# Patient Record
Sex: Female | Born: 1960 | Race: Black or African American | Hispanic: No | Marital: Married
Health system: Midwestern US, Community
[De-identification: ages and names within clinical notes are randomized; demographics above are authoritative.]

## PROBLEM LIST (undated history)

## (undated) DIAGNOSIS — I10 Essential (primary) hypertension: Secondary | ICD-10-CM

## (undated) DIAGNOSIS — E042 Nontoxic multinodular goiter: Secondary | ICD-10-CM

## (undated) DIAGNOSIS — R55 Syncope and collapse: Secondary | ICD-10-CM

## (undated) DIAGNOSIS — M5417 Radiculopathy, lumbosacral region: Secondary | ICD-10-CM

## (undated) DIAGNOSIS — F17210 Nicotine dependence, cigarettes, uncomplicated: Secondary | ICD-10-CM

## (undated) DIAGNOSIS — Z87891 Personal history of nicotine dependence: Secondary | ICD-10-CM

## (undated) DIAGNOSIS — E041 Nontoxic single thyroid nodule: Secondary | ICD-10-CM

## (undated) DIAGNOSIS — G40209 Localization-related (focal) (partial) symptomatic epilepsy and epileptic syndromes with complex partial seizures, not intractable, without status epilepticus: Secondary | ICD-10-CM

## (undated) DIAGNOSIS — Z1231 Encounter for screening mammogram for malignant neoplasm of breast: Secondary | ICD-10-CM

---

## 2004-06-20 ENCOUNTER — Other Ambulatory Visit: Admission: RE | Admit: 2004-06-20 | Discharge: 2004-06-20 | Payer: Self-pay | Admitting: Obstetrics and Gynecology

## 2007-02-28 ENCOUNTER — Ambulatory Visit: Payer: Self-pay | Admitting: *Deleted

## 2007-08-12 ENCOUNTER — Ambulatory Visit (HOSPITAL_COMMUNITY): Admission: RE | Admit: 2007-08-12 | Discharge: 2007-08-12 | Payer: Self-pay | Admitting: General Surgery

## 2007-08-16 ENCOUNTER — Encounter: Admission: RE | Admit: 2007-08-16 | Discharge: 2007-08-16 | Payer: Self-pay | Admitting: *Deleted

## 2008-01-02 ENCOUNTER — Encounter: Admission: RE | Admit: 2008-01-02 | Discharge: 2008-01-30 | Payer: Self-pay | Admitting: General Surgery

## 2008-01-17 ENCOUNTER — Ambulatory Visit (HOSPITAL_COMMUNITY): Admission: RE | Admit: 2008-01-17 | Discharge: 2008-01-18 | Payer: Self-pay | Admitting: General Surgery

## 2008-04-11 ENCOUNTER — Encounter: Admission: RE | Admit: 2008-04-11 | Discharge: 2008-04-11 | Payer: Self-pay | Admitting: General Surgery

## 2008-05-24 ENCOUNTER — Emergency Department (HOSPITAL_BASED_OUTPATIENT_CLINIC_OR_DEPARTMENT_OTHER): Admission: EM | Admit: 2008-05-24 | Discharge: 2008-05-24 | Payer: Self-pay | Admitting: Emergency Medicine

## 2010-12-30 ENCOUNTER — Emergency Department (HOSPITAL_BASED_OUTPATIENT_CLINIC_OR_DEPARTMENT_OTHER)
Admission: EM | Admit: 2010-12-30 | Discharge: 2010-12-31 | Disposition: A | Discharge status: Home / Self Care | Payer: Managed Care, Other (non HMO) | Attending: Emergency Medicine | Admitting: Emergency Medicine

## 2010-12-30 DIAGNOSIS — R51 Headache: Secondary | ICD-10-CM | POA: Insufficient documentation

## 2010-12-30 DIAGNOSIS — R040 Epistaxis: Secondary | ICD-10-CM | POA: Insufficient documentation

## 2010-12-30 DIAGNOSIS — I10 Essential (primary) hypertension: Secondary | ICD-10-CM | POA: Insufficient documentation

## 2010-12-30 DIAGNOSIS — J45909 Unspecified asthma, uncomplicated: Secondary | ICD-10-CM | POA: Insufficient documentation

## 2010-12-30 DIAGNOSIS — F17200 Nicotine dependence, unspecified, uncomplicated: Secondary | ICD-10-CM | POA: Insufficient documentation

## 2011-01-03 ENCOUNTER — Emergency Department (HOSPITAL_BASED_OUTPATIENT_CLINIC_OR_DEPARTMENT_OTHER)
Admission: EM | Admit: 2011-01-03 | Discharge: 2011-01-03 | Disposition: A | Discharge status: Home / Self Care | Payer: Managed Care, Other (non HMO) | Attending: Emergency Medicine | Admitting: Emergency Medicine

## 2011-01-03 DIAGNOSIS — I10 Essential (primary) hypertension: Secondary | ICD-10-CM | POA: Insufficient documentation

## 2011-01-03 DIAGNOSIS — J45909 Unspecified asthma, uncomplicated: Secondary | ICD-10-CM | POA: Insufficient documentation

## 2011-01-08 ENCOUNTER — Ambulatory Visit
Admission: RE | Admit: 2011-01-08 | Discharge: 2011-01-08 | Disposition: A | Discharge status: Home / Self Care | Payer: Managed Care, Other (non HMO) | Source: Ambulatory Visit | Attending: General Surgery | Admitting: General Surgery

## 2011-01-08 ENCOUNTER — Other Ambulatory Visit: Payer: Self-pay | Admitting: General Surgery

## 2011-01-08 DIAGNOSIS — R1907 Generalized intra-abdominal and pelvic swelling, mass and lump: Secondary | ICD-10-CM

## 2011-01-08 DIAGNOSIS — K439 Ventral hernia without obstruction or gangrene: Secondary | ICD-10-CM

## 2011-01-08 MED ORDER — IOHEXOL 300 MG/ML  SOLN
125.0000 mL | Freq: Once | INTRAMUSCULAR | Status: AC | PRN
  Administered 2011-01-08: 125 mL via INTRAVENOUS

## 2011-03-10 NOTE — Op Note (Signed)
NAME:  Ashley, Johnston NO.:  [id number]   MEDICAL RECORD NO.:  [id number]          PATIENT TYPE:  OIB   LOCATION:  0098                         FACILITY:  [facility]   PHYSICIAN:  Sharlet Salina T. Hoxworth, M.D.DATE OF BIRTH:  1961/07/05   DATE OF PROCEDURE:  01/17/2008  DATE OF DISCHARGE:                               OPERATIVE REPORT   PREOPERATIVE DIAGNOSES:  Morbid obesity.   POSTOPERATIVE DIAGNOSES:  Morbid obesity.   SURGICAL PROCEDURES:  Placement of laparoscopic adjustable gastric band.   SURGEON:  Dr. Johna Sheriff.   ASSISTANT:  Dr. Ovidio Kin   ANESTHESIA:  General.   BRIEF HISTORY:  Ashley Johnston is a 50 year old female with progressive  morbid obesity unresponsive to medical management and comorbidities  including hypertension and obstructive sleep apnea.  After extensive  preoperative discussion and workup detailed elsewhere, we have elected  to proceed with placement of laparoscopic adjustable gastric band for  treatment of her morbid obesity.   DESCRIPTION OF OPERATION:  The patient was brought to the operating  room, placed in supine position on the operating table and general  endotracheal anesthesia was induced.  The abdomen was widely sterilely  prepped and draped.  She had received preoperative IV antibiotics.  PAS  were placed.  Subcutaneous heparin had been administered preoperatively.  The correct patient and procedure were verified.  Local anesthesia was  used to infiltrate the trocar sites.  Abdominal access was obtained with  an 11 mm OptiVu trocar in the left subcostal space without difficulty  and pneumoperitoneum established.  Under direct vision, a 15 mm trocar  was placed in the right subcostal space through the falciform ligament  and an 11 mm trocar in the right mid abdomen, another 11 mm trocar in  the left periumbilical area for the camera port and a 5 mm trocar placed  in the left flank.  Through a 5-mm subxiphoid site, the [facility]  retractor was placed.  The liver was somewhat enlarged but we were able  to get very good exposure of the hiatus and upper stomach with the  [facility] retractor.  The angle of His was exposed and peritoneum was  incised over the left crus and careful blunt dissection was carried back  down along the left crus toward the retrogastric space with the finger  dissector.  Following this, the gastrohepatic ligament was opened along  an avascular area and the base of the right crus clearly identified.  The peritoneum here was incised at the area of crossing fat and careful  blunt dissection was carried back into the retrogastric space without  difficulty.  The finger dissector was then passed into this space and  deployed up through the previously dissected area of the angle of His  without difficulty.  Following this, a flushed AP standard band system  was introduced.  The tubing was put through the finger retractor and  then brought back behind the stomach without difficulty and the band  passed behind the stomach easily.  The sizing tube was then introduced  into the stomach and the band was  clipped into place without any undue  tension.  The sizing tube was removed.  Holding the tubing toward the  patient's feet, the fundus was imbricated up over the band with several  interrupted 2-0 Ethibond sutures.  The band appeared to be in excellent  position.  Complete hemostasis was assured.  The tubing was brought out  through the right mid abdominal site and all CO2 evacuated, the  [facility] retractor removed and trocars removed. This right mid  abdominal incision was lengthened somewhat, the anterior fascia exposed.  Four 2-0 Prolene sutures were placed for anchoring of the port.  The  tubing was cut, the port attached and then secured to the fascia with  the previously placed Prolene sutures.  The tubing was seen to curve  smoothly into the abdomen. This incision was irrigated.  The subcu at  the  port site was closed with running 3-0 Vicryl.  The skin was closed  with subcuticular 4-0 Monocryl with Dermabond.  Sponge, needle and  instrument counts were correct.  The patient was taken to recovery in  good condition.      Lorne Skeens. Hoxworth, M.D.  Electronically Signed     BTH/MEDQ  D:  01/17/2008  T:  01/17/2008  Job:  [id number]

## 2011-07-20 LAB — CBC
HCT: 37.3
Hemoglobin: 12.6
RBC: 4.09

## 2011-07-20 LAB — BASIC METABOLIC PANEL
CO2: 27
Calcium: 8.6
Chloride: 103
GFR calc Af Amer: >60
GFR calc non Af Amer: 57 — ABNORMAL LOW
Potassium: 3.6
Sodium: 136

## 2011-07-20 LAB — DIFFERENTIAL
Basophils Relative: 0
Eosinophils Absolute: 0
Eosinophils Relative: 0
Lymphocytes Relative: 10 — ABNORMAL LOW
Lymphs Abs: 1.5
Monocytes Absolute: 0.5
Monocytes Relative: 3
Neutro Abs: 12.4 — ABNORMAL HIGH

## 2011-07-20 LAB — HEMOGLOBIN AND HEMATOCRIT, BLOOD: HCT: 38.8

## 2011-07-20 LAB — PREGNANCY, URINE: Preg Test, Ur: NEGATIVE

## 2011-07-24 LAB — RAPID STREP SCREEN (MED CTR MEBANE ONLY): Streptococcus, Group A Screen (Direct): NEGATIVE

## 2011-09-25 IMAGING — CT CT ABD-PELV W/ CM
2 of 5 series · 17 of 46 positions shown, 19 images · IV contrast (30CC OMNI 300 & [ID] OMNI 300)
Comparison: None

CLINICAL DATA: Abdominal pain.  Assess for ventral hernia.  History
of gastric bypass surgery.

CT ABDOMEN AND PELVIS WITH CONTRAST
TECHNIQUE: Multidetector CT imaging of the abdomen and pelvis was
performed following the standard protocol during bolus
administration of intravenous contrast.
Contrast: 125 ml Tmnipaque-JVV.

[Series 2: abdomen w/ · axial · 0.79mm/px · z∈[-394,-44]mm · 14 of 80 slices shown, 16 images]
[im 5/80  soft-tissue]
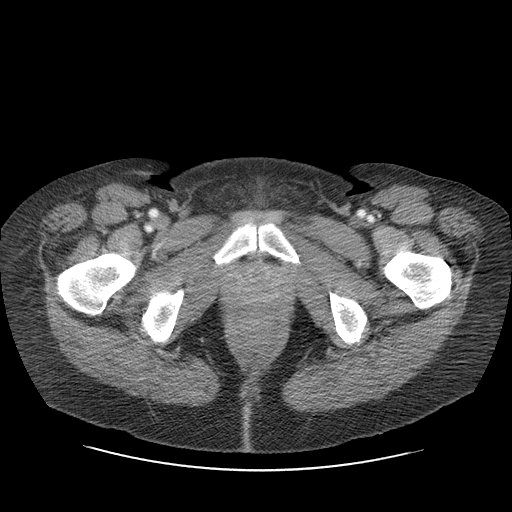
[im 5/80  bone]
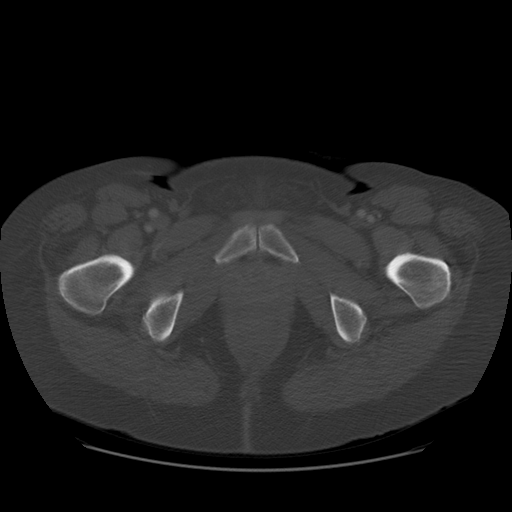
[im 9/80  soft-tissue]
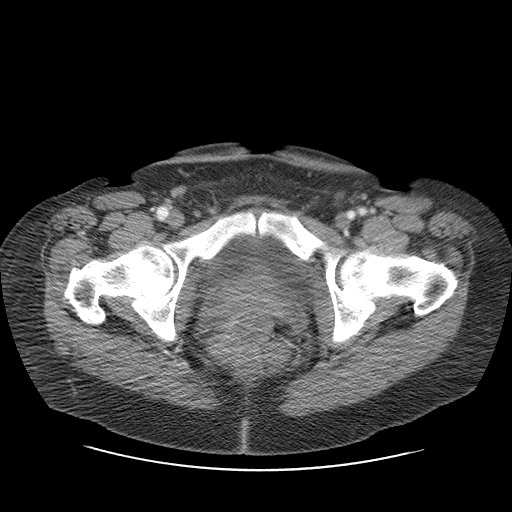
[im 18/80  soft-tissue]
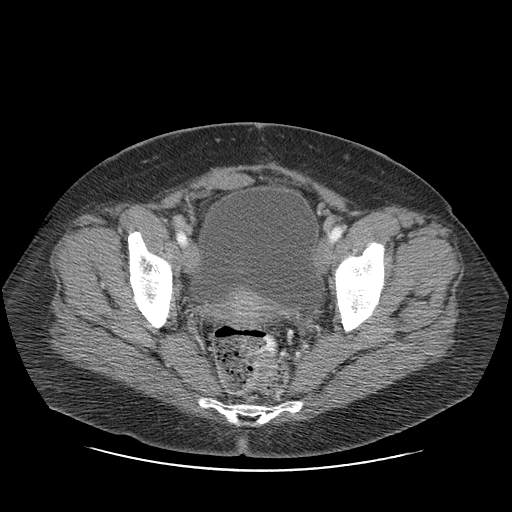
[im 22/80  soft-tissue]
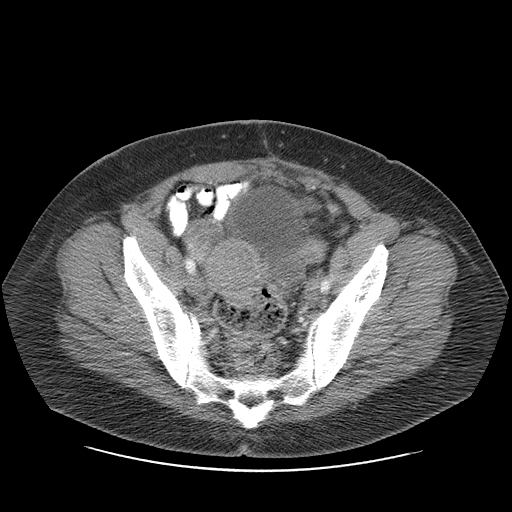
[im 27/80  soft-tissue]
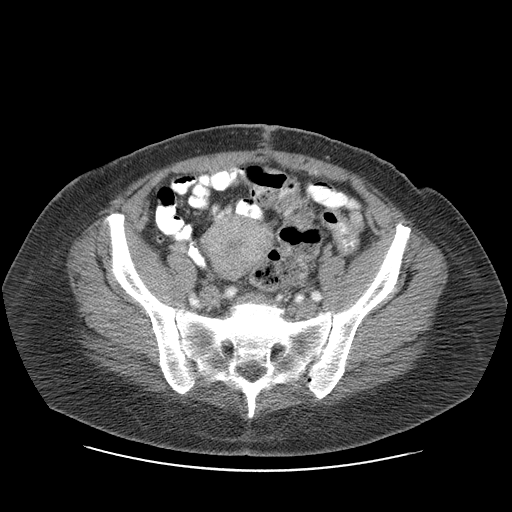
[im 31/80  soft-tissue]
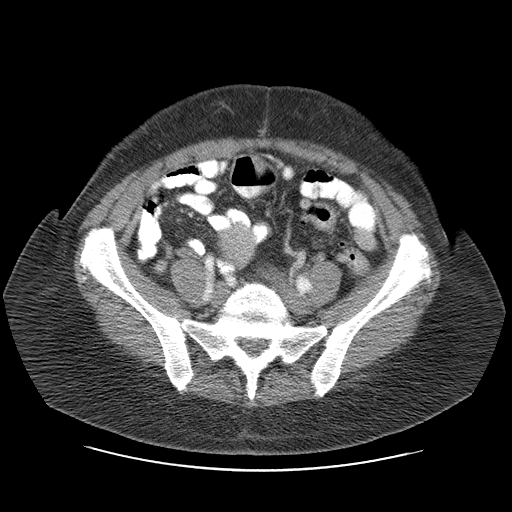
[im 36/80  soft-tissue]
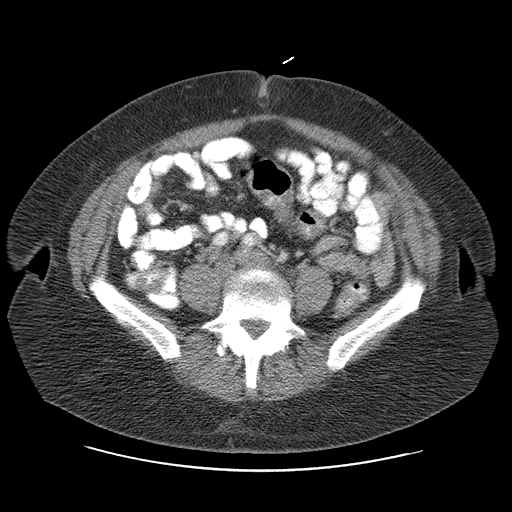
[im 44/80  soft-tissue]
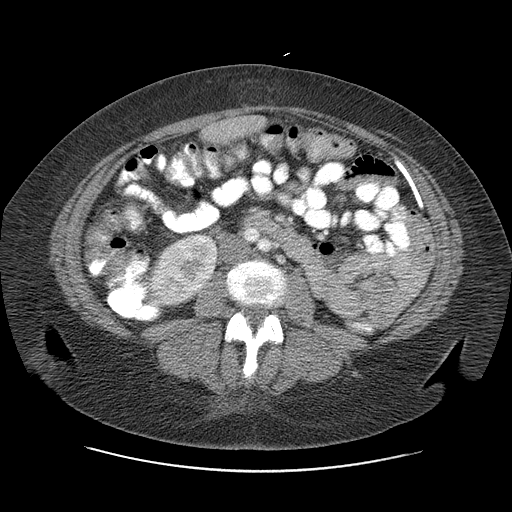
[im 49/80  soft-tissue]
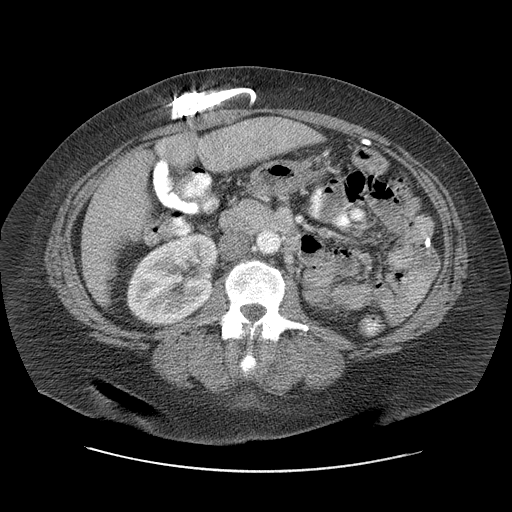
[im 49/80  bone]
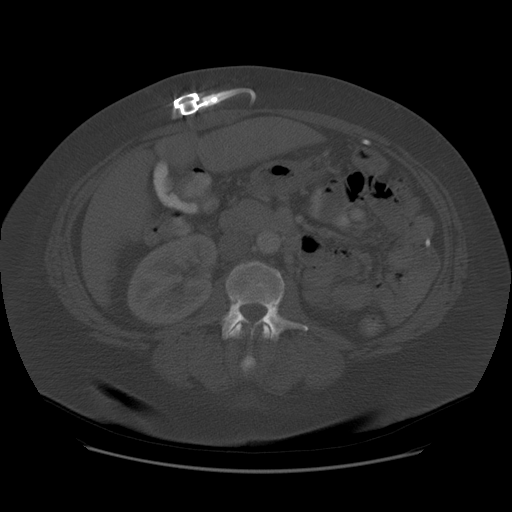
[im 53/80  soft-tissue]
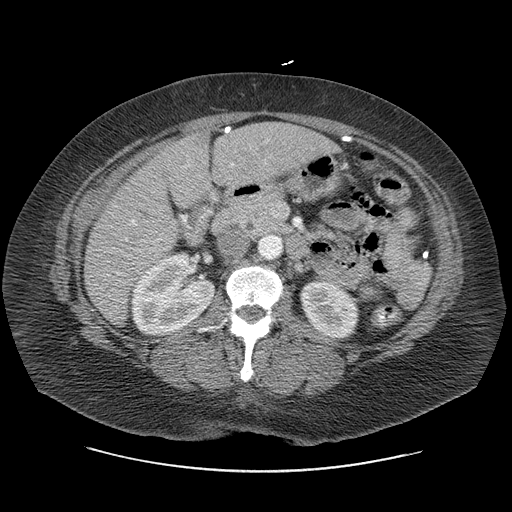
[im 58/80  soft-tissue]
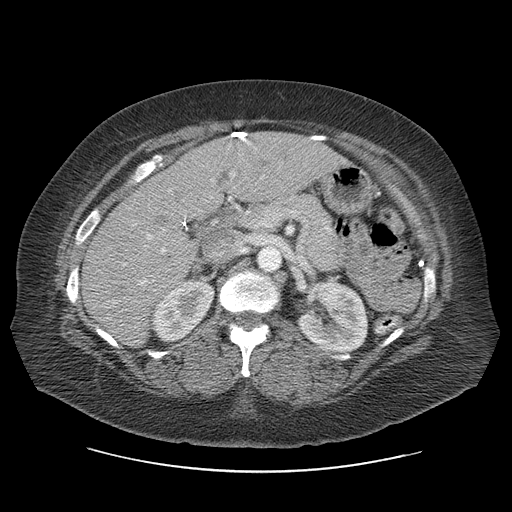
[im 62/80  soft-tissue]
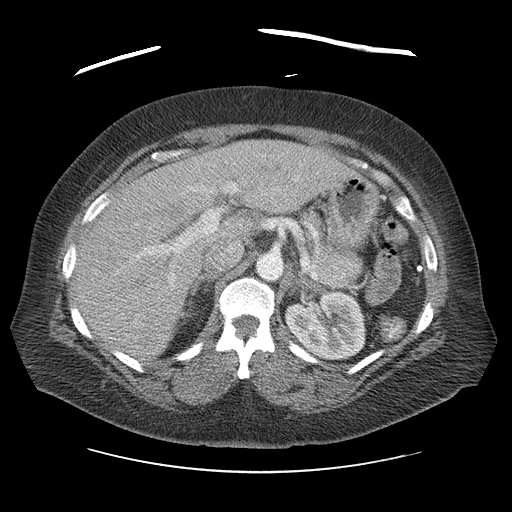
[im 71/80  soft-tissue]
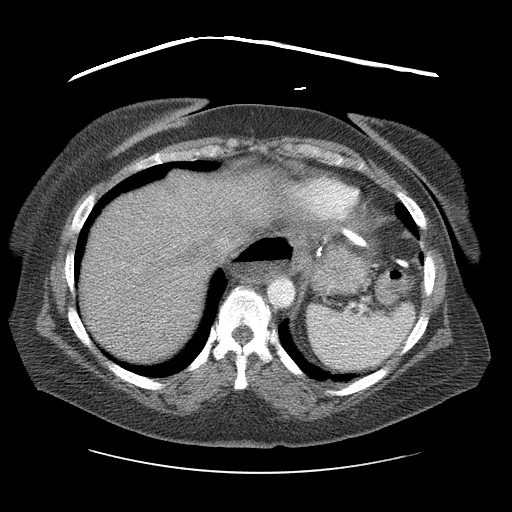
[im 75/80  soft-tissue]
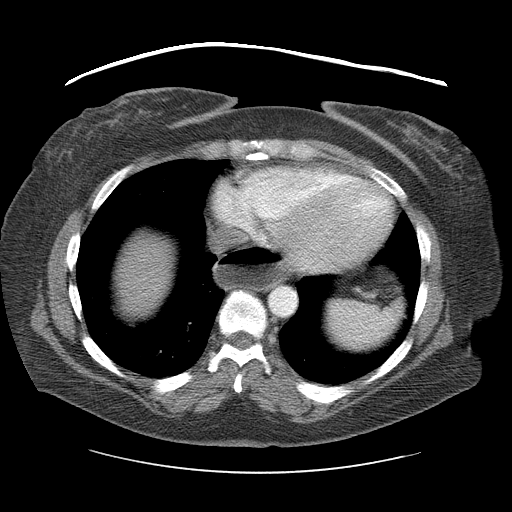

[Series 400: cor · coronal · 0.83mm/px · 3 of 170 slices shown]
[im 57/170  soft-tissue]
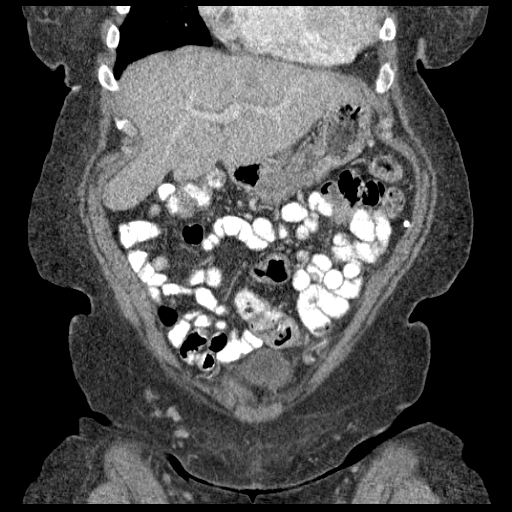
[im 76/170  soft-tissue]
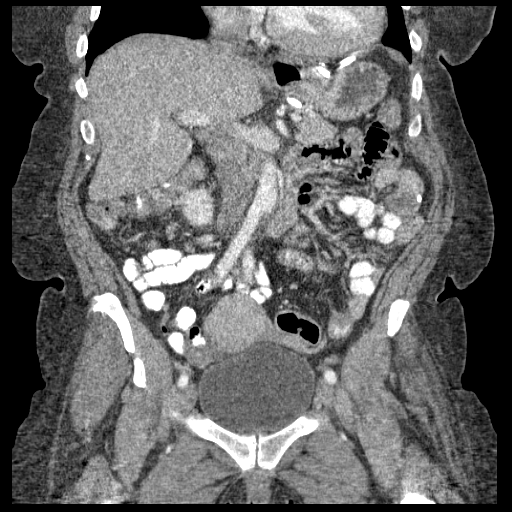
[im 94/170  soft-tissue]
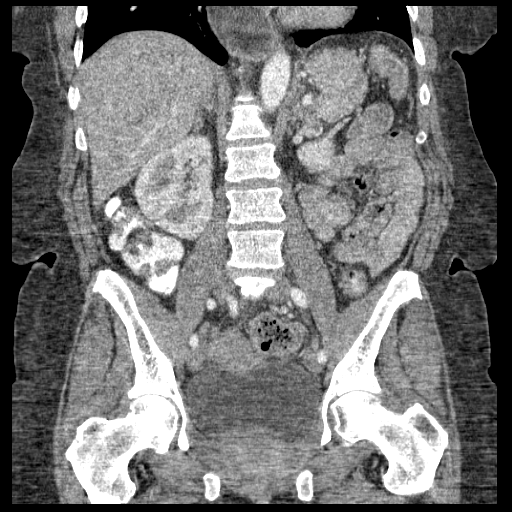

[17 of 46 positions shown; findings below may reference images not displayed]

FINDINGS: The lung bases are clear.  There is a moderate-to-large
hiatal hernia.

The liver demonstrates no significant findings.  The gallbladder is
surgically absent.  No common bile duct dilatation.  Pancreas is
unremarkable and spleen is normal in size. The adrenal glands and
kidneys demonstrate no significant findings.

A gastric lap band is noted.  It appears to be well positioned with
normal orientation.  The duodenum, small bowel and colon
demonstrate no significant findings.  The appendix is normal.  No
mesenteric or retroperitoneal masses or adenopathy. The aorta is
normal in caliber.  No dissection.

The uterus and ovaries are unremarkable.  No pelvic mass,
adenopathy or free pelvic fluid collection.  No inguinal mass or
hernia. A very small periumbilical abdominal wall hernia is noted
containing a small amount of fat.

The bony structures are unremarkable.
IMPRESSION: 1.  Gastric lap band appears in good position with normal
orientation but there is a moderate-to-large hiatal hernia.
2.  Small periumbilical abdominal wall hernia containing small
amount of fat.
3.  No significant intra abdominal/pelvic findings.

## 2017-11-15 ENCOUNTER — Encounter: Attending: Physician Assistant | Primary: Physician Assistant

## 2017-12-07 ENCOUNTER — Ambulatory Visit: Attending: Physician Assistant | Primary: Physician Assistant

## 2017-12-07 ENCOUNTER — Ambulatory Visit
Admit: 2017-12-07 | Discharge: 2017-12-07 | Payer: PRIVATE HEALTH INSURANCE | Attending: Physician Assistant | Primary: Physician Assistant

## 2017-12-07 DIAGNOSIS — R7301 Impaired fasting glucose: Secondary | ICD-10-CM

## 2017-12-07 MED ORDER — AMLODIPINE 5 MG TAB
5 mg | ORAL_TABLET | Freq: Every day | ORAL | 0 refills | Status: DC
Start: 2017-12-07 — End: 2018-02-07

## 2017-12-07 MED ORDER — HYDROCHLOROTHIAZIDE 25 MG TAB
25 mg | ORAL_TABLET | Freq: Every day | ORAL | 0 refills | Status: DC
Start: 2017-12-07 — End: 2018-02-07

## 2017-12-07 MED ORDER — ATENOLOL 50 MG TAB
50 mg | ORAL_TABLET | Freq: Every day | ORAL | 0 refills | Status: DC
Start: 2017-12-07 — End: 2018-02-07

## 2017-12-07 NOTE — Addendum Note (Signed)
Addended by: Trey Sailors on: 12/21/2017 11:30 AM     Modules accepted: Orders

## 2017-12-07 NOTE — Progress Notes (Addendum)
ESTABLISH CARE VISIT    SUBJECTIVE:     Chief Complaint   Patient presents with   ??? Establish Care   ??? Hypertension     Been without BP meds for approx 6 weeks.       HPI: 57 y.o. female with PMHx significant for  is here for the above chief complaint(s).    Hypertension  Diagnosed at [place], Patient has taken lisinopril, losartan, cardizem in the past. She most recently taking atenolol 50mg  daily, amlodipine 5mg , hctz 25mg . BP is usually controlled on these medications, has been out for 6 weeks.   History- was having chest pain, so cardiac cath was performed and surgeon planned on doing angioplasty, but arteries were clear.     Gastric Band:  10 years, hasn't had any follow-up with gastroenterologist. Would like a follow-up. She denies abdominal pain, n/v/d.       Menopause:  >3 years,     Health Maintenance:    Eye ??? needs referral,    Oral Health ??? dental health, will give name.   U/A ???  no UTI sxs.   Cardiac-  Pulmonary health/Smoking history- , current every day smoker of 1ppd for 30 years, denies cough, SOB.-  Mammo/GYN- due for mammogram, will order  Colonoscopy - has never had baseline, will refer  Current smoker- 0.5-1ppd x 30+  years-      Review of Systems   Constitutional: Negative for chills, fever, malaise/fatigue and weight loss.   Eyes: Negative for blurred vision, double vision and pain.   Respiratory: Negative for cough, sputum production, shortness of breath and wheezing.    Cardiovascular: Negative for chest pain, palpitations, orthopnea, claudication and leg swelling.   Gastrointestinal: Negative for abdominal pain, constipation, diarrhea, nausea and vomiting.   Genitourinary: Negative for dysuria, frequency and urgency.   Neurological: Negative for dizziness, tingling and headaches.         @CURRMEDS @  Current Outpatient Medications   Medication Sig   ??? atenolol (TENORMIN) 50 mg tablet Take  by mouth daily.   ??? amLODIPine (NORVASC) 5 mg tablet Take 5 mg by mouth daily.    ??? hydroCHLOROthiazide (HYDRODIURIL) 25 mg tablet Take 25 mg by mouth daily.   ??? folic acid/multivit-min/lutein (CENTRUM SILVER PO) Take  by mouth.     No current facility-administered medications for this visit.      Health Maintenance   Topic Date Due   ??? Hepatitis C Screening  03/20/1961   ??? DTaP/Tdap/Td series (1 - Tdap) 06/24/1983   ??? PAP AKA CERVICAL CYTOLOGY  06/24/1983   ??? Shingrix Vaccine Age 38> (1 of 2) 06/23/2012   ??? BREAST CANCER SCRN MAMMOGRAM  06/23/2012   ??? FOBT Q 1 YEAR AGE 24-75  06/23/2012   ??? Influenza Age 61 to Adult  05/26/2017       Medications and Allergies: Reviewed and confirmed in the chart    Past Medical Hx: Reviewed and confirmed in the chart  Past Medical History:   Diagnosis Date   ??? Asthma    ??? Hypertension        Patient Active Problem List   Diagnosis Code   ??? Severe obesity ([place]) E66.01       Family Hx, Surgical Hx, Social Hx: Reviewed and updated in EMR    OBJECTIVE:  Vitals:    12/07/17 1250   BP: (!) 180/98   Pulse: 87   Resp: 16   Temp: 97.6 ??F (36.4 ??C)   TempSrc: Oral  SpO2: 100%   Weight: 224 lb 6.4 oz (101.8 kg)   Height: 5\' 7"  (1.702 m)       BP Readings from Last 3 Encounters:   12/07/17 (!) 180/98     Wt Readings from Last 3 Encounters:   12/07/17 224 lb 6.4 oz (101.8 kg)       Physical Exam   Constitutional: She is oriented to person, place, and time and well-developed, well-nourished, and in no distress. No distress.   Neck: Normal range of motion. Neck supple. No thyromegaly present.   Cardiovascular: Normal rate, regular rhythm, normal heart sounds and intact distal pulses. Exam reveals no gallop and no friction rub.   No murmur heard.  Pulmonary/Chest: Effort normal and breath sounds normal. No respiratory distress. She has no wheezes. She has no rales. She exhibits no tenderness.   Lymphadenopathy:     She has no cervical adenopathy.   Neurological: She is alert and oriented to person, place, and time.   Skin: Skin is warm and dry. She is not diaphoretic.    Psychiatric: Mood, memory, affect and judgment normal.   Vitals reviewed.          Nursing Notes Reviewed  ASSESSMENT AND PLAN  Diagnoses and all orders for this visit:    1. Elevated fasting glucose  -     MICROALBUMIN, UR, RAND W/ MICROALB/CREAT RATIO; Future  -     HEMOGLOBIN A1C WITH EAG; Future    2. Screening for lipid disorders  -     LIPID PANEL; Future    3. Vitamin D deficiency  -     VITAMIN D, 1, 25 DIHYDROXY; Future    4. Hx of laparoscopic gastric banding  -     REFERRAL TO GASTROENTEROLOGY  -     VITAMIN B12; Future    5. Hypertension, unspecified type  -     CBC WITH AUTOMATED DIFF; Future  -     METABOLIC PANEL, COMPREHENSIVE; Future  -     MICROALBUMIN, UR, RAND W/ MICROALB/CREAT RATIO; Future  -     atenolol (TENORMIN) 50 mg tablet; Take 1 Tab by mouth daily.  -     amLODIPine (NORVASC) 5 mg tablet; Take 1 Tab by mouth daily.  -     hydroCHLOROthiazide (HYDRODIURIL) 25 mg tablet; Take 1 Tab by mouth daily.    6. Screening for thyroid disorder  -     TSH 3RD GENERATION; Future    7. B12 deficiency  -     VITAMIN B12; Future    8. Screening mammogram, encounter for  -     MAM MAMMO BI SCREENING INCL CAD; Future    9. Thyromegaly  -     T4, FREE; Future  -     T3 TOTAL; Future  -     US THYROID/PARATHYROID/SOFT TISS; Future    10. Continuous tobacco abuse  -     CT LOW DOSE LUNG CANCER SCREENING; Future    11. Encounter for eye exam  -     REFERRAL TO OPHTHALMOLOGY    RTC in 1 month for BP check and lab review.           Orders Placed This Encounter   ??? atenolol (TENORMIN) 50 mg tablet   ??? amLODIPine (NORVASC) 5 mg tablet   ??? hydroCHLOROthiazide (HYDRODIURIL) 25 mg tablet   ??? folic acid/multivit-min/lutein (CENTRUM SILVER PO)       No future appointments.  AVS printed and provided to patient    Assessment and plan above discussed with patient, patient voiced understanding and agreement with plan.    More than 50% of this 30 minute visit was spent face to face counseling  the patient about the etiology and treatment options for the above health conditions outlined in assessment and plan     Mikki Ziff L. [facility]  [address], [place] [address]  Ph - [phone]

## 2017-12-07 NOTE — Progress Notes (Signed)
Chief Complaint   Patient presents with   ??? Establish Care   ??? Hypertension     Been without BP meds for approx 6 weeks.

## 2017-12-14 ENCOUNTER — Encounter

## 2017-12-16 ENCOUNTER — Inpatient Hospital Stay: Payer: BLUE CROSS/BLUE SHIELD | Attending: Physician Assistant | Primary: Physician Assistant

## 2017-12-16 ENCOUNTER — Ambulatory Visit: Primary: Physician Assistant

## 2017-12-17 NOTE — Telephone Encounter (Signed)
Received call from [facility] requesting ICD-10 code be changed to Dx: F17.210.    Pt's CT is scheduled for next week 12/24/2017.

## 2017-12-20 NOTE — Telephone Encounter (Signed)
Vicki Greene called to check on status of request from Friday and also she needs dr's notes asap or they will have to [place]

## 2017-12-21 ENCOUNTER — Encounter: Attending: Physician Assistant | Primary: Physician Assistant

## 2017-12-21 NOTE — Telephone Encounter (Signed)
error 

## 2017-12-22 ENCOUNTER — Encounter: Primary: Physician Assistant

## 2017-12-22 NOTE — Progress Notes (Signed)
LUNG CANCER SCREENING:  This Letter was mailed to the Patient and faxed to the ordering physician.    Vicki Greene  [address] [place] [address]       Dear [name],  RE: Your low-dose CT lung screening done on: 12/24/2017  Interpreted by: Dr. Rodman Pickle  Report sent to: Gevena Barre PA  Thank you for choosing [facility] for your recent CT lung screening. As we discussed over the phone, we are sending this follow-up letter to confirm your testing results. Your CT lung screening showed no nodule or mass. However, there is a finding on your exam, which may require further evaluation. This does not necessarily mean there is a serious problem, but it should not be ignored. Please contact your health care provider to discuss these results and the next step in your medical care. Your next lung screening exam is scheduled for: March 2020. Because this notice is far in advance, we will send you a reminder when the time gets closer.  Here are some other important points:  ? Please remember that you have freedom of choice when choosing a health care facility.   ? Your full low-dose CT lung screening report, including any minor observations, has been sent to your health care provider. Your exam report and images will be kept on file at [facility] as part of your permanent record and are available for your continuing care.  ? Although low-dose CT lung screening is very effective at finding lung cancer early, it cannot find all lung cancers. If you develop any new symptoms such as shortness of breath or chest pain, or you begin coughing up blood, please contact your doctor immediately.  ? Please keep in mind that good health involves quitting smoking (for help, call 1-800-QUIT-NOW), an annual physical exam and a yearly low-dose CT lung screening.  As I stated during our last phone conversation, I am always here to answer  any questions you may have. If you have further questions about your results, questions about this letter or difficulty contacting your health care provider, please don???t hesitate to call me at [phone].   Sincerely,       Schuyler Amor, R.N., B.S.N.  Thoracic and Lung Health Nurse Navigator  Phone: [place]  12/28/2017

## 2017-12-24 ENCOUNTER — Inpatient Hospital Stay: Admit: 2017-12-24 | Payer: BLUE CROSS/BLUE SHIELD | Attending: Physician Assistant | Primary: Physician Assistant

## 2017-12-24 ENCOUNTER — Inpatient Hospital Stay
Admit: 2017-12-24 | Discharge: 2017-12-24 | Payer: BLUE CROSS/BLUE SHIELD | Attending: Physician Assistant | Primary: Physician Assistant

## 2017-12-24 ENCOUNTER — Encounter

## 2017-12-24 DIAGNOSIS — E010 Iodine-deficiency related diffuse (endemic) goiter: Secondary | ICD-10-CM

## 2017-12-24 DIAGNOSIS — Z1231 Encounter for screening mammogram for malignant neoplasm of breast: Secondary | ICD-10-CM

## 2017-12-24 LAB — METABOLIC PANEL, COMPREHENSIVE
ALT (SGPT): 15 U/L (ref 12–78)
AST (SGOT): 18 U/L (ref 15–37)
Albumin: 3.6 gm/dl (ref 3.4–5.0)
Alk. phosphatase: 115 U/L (ref 45–117)
Anion gap: 5 mmol/L (ref 5–15)
BUN: 11 mg/dl (ref 7–25)
Bilirubin, total: 0.4 mg/dl (ref 0.2–1.0)
CO2: 28 mEq/L (ref 21–32)
Calcium: 9 mg/dl (ref 8.5–10.1)
Chloride: 106 mEq/L (ref 98–107)
Creatinine: 1 mg/dl (ref 0.6–1.3)
GFR est AA: >60
GFR est non-AA: >60
Glucose: 87 mg/dl (ref 74–106)
Potassium: 3.9 mEq/L (ref 3.5–5.1)
Protein, total: 7.1 gm/dl (ref 6.4–8.2)
Sodium: 139 mEq/L (ref 136–145)

## 2017-12-24 LAB — T4, FREE: Free T4: 0.82 ng/dl (ref 0.76–1.46)

## 2017-12-24 LAB — CBC WITH AUTOMATED DIFF
BASOPHILS: 0.6 % (ref 0–3)
EOSINOPHILS: 2.6 % (ref 0–5)
HCT: 41.6 % (ref 37.0–50.0)
HGB: 13.4 gm/dl (ref 13.0–17.2)
IMMATURE GRANULOCYTES: 0.3 % (ref 0.0–3.0)
LYMPHOCYTES: 45.2 % (ref 28–48)
MCH: 31.3 pg (ref 25.4–34.6)
MCHC: 32.2 gm/dl (ref 30.0–36.0)
MCV: 97.2 fL (ref 80.0–98.0)
MONOCYTES: 7.7 % (ref 1–13)
MPV: 10.2 fL — ABNORMAL HIGH (ref 6.0–10.0)
NEUTROPHILS: 43.6 % (ref 34–64)
NRBC: 0 (ref 0–0)
PLATELET: 245 10*3/uL (ref 140–450)
RBC: 4.28 M/uL (ref 3.60–5.20)
RDW-SD: 46 (ref 36.4–46.3)
WBC: 6.6 10*3/uL (ref 4.0–11.0)

## 2017-12-24 LAB — TSH 3RD GENERATION: TSH: 1.27 u[IU]/mL (ref 0.358–3.740)

## 2017-12-25 ENCOUNTER — Inpatient Hospital Stay: Payer: BLUE CROSS/BLUE SHIELD | Attending: Physician Assistant | Primary: Physician Assistant

## 2017-12-26 LAB — VITAMIN D, 1, 25 DIHYDROXY
Vitamin D,1,25 Dihydroxy: 65 pg/mL (ref 18–72)
Vitamin D2, 1,25 (OH)2: <8 pg/mL
Vitamin D3, 1,25 (OH)2: 65 pg/mL

## 2017-12-27 ENCOUNTER — Telehealth

## 2017-12-27 NOTE — Telephone Encounter (Signed)
Spoke with patient about her lab results. All WNL.    Mykeal Carrick L. Lonisha Bobby, PA-C  [facility]  [address]  [place] [address]

## 2017-12-27 NOTE — Telephone Encounter (Signed)
Spoke with patient regarding her thyroid ultrasound. Because one of the nodules is larger and hypervascular, will refer to ENT. Also discussed CT results, her esophagus is fluid-filled, radiology recommending GI evaluation. Patient has referral for colonoscopy, will send CT results to GI and recommend EGD. Patient is amenable to plan.     Joleen Stuckert L. Negar Sieler, PA-C  [facility]  [address]  [place] [address]

## 2017-12-28 NOTE — Telephone Encounter (Signed)
Can you please send this patient's CT to the gastro doc that we recently referred her to, please?    Dalanie Kisner L. Destanie Tibbetts, PA-C  [facility]  [address]  [place] [address]

## 2017-12-30 NOTE — Telephone Encounter (Signed)
Done!

## 2018-01-07 NOTE — Telephone Encounter (Signed)
Please let patient know her mammogram was negative. Repeat in 1 year.     Ori Trejos L. Neshawn Aird, PA-C  [facility]  [address]  [place] [address]

## 2018-01-07 NOTE — Telephone Encounter (Signed)
Patient informed of mammogram results as detailed in providers notes. Patient verbalized understanding.

## 2018-01-18 ENCOUNTER — Encounter

## 2018-01-28 ENCOUNTER — Ambulatory Visit

## 2018-01-28 ENCOUNTER — Inpatient Hospital Stay: Admit: 2018-01-28 | Payer: BLUE CROSS/BLUE SHIELD | Attending: Otolaryngology | Primary: Physician Assistant

## 2018-01-28 ENCOUNTER — Encounter

## 2018-01-28 DIAGNOSIS — D34 Benign neoplasm of thyroid gland: Secondary | ICD-10-CM

## 2018-01-28 MED ORDER — LIDOCAINE (PF) 10 MG/ML (1 %) IJ SOLN
10 mg/mL (1 %) | Freq: Once | INTRAMUSCULAR | Status: AC
Start: 2018-01-28 — End: 2018-01-28
  Administered 2018-01-28: 14:00:00 via SUBCUTANEOUS

## 2018-01-28 NOTE — Progress Notes (Signed)
Interventional Radiologist: Barbaraann Boys, MD  Pre-operative Diagnosis: Left thyroid nodule    Post-operative Diagnosis: Same as pre-operative diagnosis    Procedure(s) Performed:  Ultrasound guided thyroid Fine Needle Aspiration    Anesthesia:  Local     Findings:   Informed consent.  US guided thyroid FNA performed of left thyroid nodule.  Technically difficult secondary to lower pole location    Complications: No immediate    Estimated Blood Loss:  Minimal    Specimens: 5 passes    Barbaraann Boys, MD  January 28, 2018  11:25 AM

## 2018-01-28 NOTE — H&P (Signed)
BRIEF HISTORY AND PHYSICAL    Clinical History:   Left thyroid nodule    HPI:  Patient with left  thyroid nodule for ultrasound fine needle aspiration    Allergies:      Allergies   Allergen Reactions   ??? Lisinopril Swelling   ??? Pcn [Penicillins] Rash       Current Meds:       Current Outpatient Medications on File Prior to Encounter   Medication Sig Dispense Refill   ??? folic acid/multivit-min/lutein (CENTRUM SILVER PO) Take  by mouth.     ??? atenolol (TENORMIN) 50 mg tablet Take 1 Tab by mouth daily. 90 Tab 0   ??? amLODIPine (NORVASC) 5 mg tablet Take 1 Tab by mouth daily. 90 Tab 0   ??? hydroCHLOROthiazide (HYDRODIURIL) 25 mg tablet Take 1 Tab by mouth daily. 90 Tab 0     No current facility-administered medications on file prior to encounter.            @mednamec @    Comorbid Conditions:    Past Medical History:   Diagnosis Date   ??? Asthma    ??? Hypertension           Past Surgical History:   Procedure Laterality Date   ??? HX CHOLECYSTECTOMY     ??? HX GI  2009    lap band    ??? HX GYN      c-section   ??? HX GYN      ovarian cyst   ??? HX GYN      ablation   ??? HX ORTHOPAEDIC      fractured ankle       Data:    Visit Vitals  BP 144/88 (BP 1 Location: Left arm)   Pulse 77   Resp 17   :    No results found for: PTP, INR, APTT    CBC w/Diff    Lab Results   Component Value Date/Time    WBC 6.6 12/24/2017 08:57 AM    RBC 4.28 12/24/2017 08:57 AM    HGB 13.4 12/24/2017 08:57 AM    HCT 41.6 12/24/2017 08:57 AM    MCV 97.2 12/24/2017 08:57 AM    MCH 31.3 12/24/2017 08:57 AM    MCHC 32.2 12/24/2017 08:57 AM    PLT 245 12/24/2017 08:57 AM        Lab Results   Component Value Date/Time    GRANS 43.6 12/24/2017 08:57 AM    LYMPH 45.2 12/24/2017 08:57 AM    MONOS 7.7 12/24/2017 08:57 AM    EOS 2.6 12/24/2017 08:57 AM    BASOS 0.6 12/24/2017 08:57 AM      Basic Metabolic Profile   Lab Results   Component Value Date    NA 139 12/24/2017    K 3.9 12/24/2017    CL 106 12/24/2017    CO2 28 12/24/2017    BUN 11 12/24/2017     CREA 1.0 12/24/2017    GLU 87 12/24/2017    CA 9.0 12/24/2017           No results found for: HCGUQC, [id number], [id number], [id number], PREGU, POCHCG, MHCGN, HCGQR, THCGA1, SHCG, HCGN, HCGSERUM, HCGURQLPOC     Physical Exam:     General Appearance:   Appears in no acute distress.     Site:  No redness, swelling    Impression:  Left thyroid nodule for ultrasound guided fine needle aspiration    Plan:  NPO  Consent  Barbaraann Boys, MD  January 28, 2018  11:24 AM  Vascular & Interventional Radiology

## 2018-02-03 NOTE — Telephone Encounter (Signed)
Labs still in process. Can take up to 2 weeks for pathology results to come back. Thank you.    Zienna Ahlin L. Tyashia Morrisette, PA-C  [facility]  [address]  [place] [address]

## 2018-02-03 NOTE — Telephone Encounter (Signed)
Pt requesting Biopsy results. States had done a week ago.  States please call to discuss

## 2018-02-03 NOTE — Telephone Encounter (Signed)
Patient informed that biopsy results are still in process and can take up to 2 weeks for results.  Patient verbalized understanding.

## 2018-02-07 ENCOUNTER — Encounter

## 2018-02-07 NOTE — Telephone Encounter (Signed)
Per fax from [facility] requesting a 90 day supply  Requested Prescriptions     Pending Prescriptions Disp Refills   ??? atenolol (TENORMIN) 50 mg tablet 90 Tab 0     Sig: Take 1 Tab by mouth daily.   ??? amLODIPine (NORVASC) 5 mg tablet 90 Tab 0     Sig: Take 1 Tab by mouth daily.   ??? hydroCHLOROthiazide (HYDRODIURIL) 25 mg tablet 90 Tab 0     Sig: Take 1 Tab by mouth daily.

## 2018-02-08 MED ORDER — HYDROCHLOROTHIAZIDE 25 MG TAB
25 mg | ORAL_TABLET | Freq: Every day | ORAL | 0 refills | Status: DC
Start: 2018-02-08 — End: 2018-04-23

## 2018-02-08 MED ORDER — ATENOLOL 50 MG TAB
50 mg | ORAL_TABLET | Freq: Every day | ORAL | 0 refills | Status: DC
Start: 2018-02-08 — End: 2018-04-23

## 2018-02-08 MED ORDER — AMLODIPINE 5 MG TAB
5 mg | ORAL_TABLET | Freq: Every day | ORAL | 0 refills | Status: DC
Start: 2018-02-08 — End: 2018-04-23

## 2018-03-17 ENCOUNTER — Ambulatory Visit: Attending: Physician Assistant | Primary: Physician Assistant

## 2018-03-17 ENCOUNTER — Ambulatory Visit
Admit: 2018-03-17 | Discharge: 2018-03-17 | Payer: PRIVATE HEALTH INSURANCE | Attending: Physician Assistant | Primary: Physician Assistant

## 2018-03-17 DIAGNOSIS — F17210 Nicotine dependence, cigarettes, uncomplicated: Secondary | ICD-10-CM

## 2018-03-17 MED ORDER — LANSOPRAZOLE 30 MG CAP, DELAYED RELEASE
30 mg | ORAL_CAPSULE | Freq: Every day | ORAL | 0 refills | Status: AC
Start: 2018-03-17 — End: ?

## 2018-03-17 MED ORDER — BUPROPION SR 100 MG TAB
100 mg | ORAL_TABLET | Freq: Two times a day (BID) | ORAL | 1 refills | Status: AC
Start: 2018-03-17 — End: ?

## 2018-03-17 NOTE — Progress Notes (Signed)
 Chief Complaint   Patient presents with   . Hypertension     follow up   . Thyroid Problem     f/u from ent   . Epigastric Pain     f/u from gastro   . Hiatal Hernia     f/u from gastro       .SABRA1. Have you been to the ER, urgent care clinic since your last visit?  Hospitalized since your last visit?Yes Reason for visit: Thyroid    2. Have you seen or consulted any other health care providers outside of the [facility] since your last visit?  Include any pap smears or colon screening. Yes Where: ENT

## 2018-03-17 NOTE — Progress Notes (Signed)
Follow Up Visit Note    Chief Complaint   Patient presents with   ??? Hypertension     follow up   ??? Thyroid Problem     f/u from ent   ??? Epigastric Pain     f/u from gastro   ??? Hiatal Hernia     f/u from gastro       HPI:  Vicki Greene is a 57 y.o. African American female  has a past medical history of Asthma and Hypertension.  is here for the above complaint(s).     Hiatal Hernia  Dr. Fredric Dine does not plan to do anything with hiatal hernia at this time. Plan is to eat high fiber diet and to sit up for hours after eating. She was also put on omeprazole 20mg  daily. She has been taking this medication as prescribed.     Tobacco Abuse  Patient has history of cigarette smoking. Patient is a current every day smoker that she smokes 1.0 pack per day for the past 30 years. She really would like to quit smoking. She has tried other options in the past, including nicotine replacements. She would like to try a medication to help with smoking cessation at this time.       Review of Systems   Constitutional: Negative for chills, fever, malaise/fatigue and weight loss.   Eyes: Negative for blurred vision, double vision and pain.   Respiratory: Negative for cough, sputum production, shortness of breath and wheezing.    Cardiovascular: Negative for chest pain, palpitations, orthopnea, claudication and leg swelling.   Gastrointestinal: Negative for abdominal pain, constipation, diarrhea, nausea and vomiting.   Genitourinary: Negative for dysuria, frequency and urgency.   Neurological: Negative for dizziness, tingling and headaches.         Current Outpatient Medications   Medication Sig   ??? omeprazole (PRILOSEC) 20 mg capsule take 1 capsule by mouth ONCE A DAY   ??? atenolol (TENORMIN) 50 mg tablet Take 1 Tab by mouth daily.   ??? amLODIPine (NORVASC) 5 mg tablet Take 1 Tab by mouth daily.   ??? hydroCHLOROthiazide (HYDRODIURIL) 25 mg tablet Take 1 Tab by mouth daily.   ??? folic acid/multivit-min/lutein (CENTRUM SILVER PO) Take  by mouth.      No current facility-administered medications for this visit.      Health Maintenance   Topic Date Due   ??? Hepatitis C Screening  June 21, 1961   ??? Pneumococcal 0-64 years (1 of 1 - PPSV23) 06/23/1968   ??? DTaP/Tdap/Td series (1 - Tdap) 06/24/1983   ??? PAP AKA CERVICAL CYTOLOGY  06/24/1983   ??? Shingrix Vaccine Age 69> (1 of 2) 06/23/2012   ??? FOBT Q 1 YEAR AGE 59-75  06/23/2012   ??? Influenza Age 41 to Adult  05/26/2018   ??? BREAST CANCER SCRN MAMMOGRAM  12/25/2019       There is no immunization history on file for this patient.    Allergies and Medications: Reviewed and updated in EMR.     Past Medical History:   Diagnosis Date   ??? Asthma    ??? Hypertension        Surgical History: Reviewed and updated in EMR as appropriate.  Social History: Reviewed and updated in EMR as appropriate.  Family History: Reviewed and updated in EMR as appropriate.    OBJECTIVE:   Visit Vitals  BP 114/77   Pulse 74   Temp 97.4 ??F (36.3 ??C) (Oral)   Resp 18   Ht  5\' 7"  (1.702 m)   Wt 213 lb (96.6 kg)   SpO2 100%   BMI 33.36 kg/m??        Physical Exam   Constitutional: She is oriented to person, place, and time and well-developed, well-nourished, and in no distress. No distress.   Neck: Normal range of motion. Neck supple. No thyromegaly present.   Cardiovascular: Normal rate, regular rhythm, normal heart sounds and intact distal pulses. Exam reveals no gallop and no friction rub.   No murmur heard.  Pulmonary/Chest: Effort normal and breath sounds normal. No respiratory distress. She has no wheezes. She has no rales. She exhibits no tenderness.   Lymphadenopathy:     She has no cervical adenopathy.   Neurological: She is alert and oriented to person, place, and time.   Skin: Skin is warm and dry. She is not diaphoretic.   Psychiatric: Mood, memory, affect and judgment normal.   Vitals reviewed.      LABS/RADIOLOGICAL TESTS:  Lab Results   Component Value Date/Time    WBC 6.6 12/24/2017 08:57 AM    HGB 13.4 12/24/2017 08:57 AM    HCT 41.6  12/24/2017 08:57 AM    PLATELET 245 12/24/2017 08:57 AM     Lab Results   Component Value Date/Time    Sodium 139 12/24/2017 08:57 AM    Potassium 3.9 12/24/2017 08:57 AM    Chloride 106 12/24/2017 08:57 AM    CO2 28 12/24/2017 08:57 AM    Glucose 87 12/24/2017 08:57 AM    BUN 11 12/24/2017 08:57 AM    Creatinine 1.0 12/24/2017 08:57 AM     No results found for: CHOL, CHOLX, CHLST, CHOLV, HDL, LDL, LDLC, DLDLP, TGLX, TRIGL, TRIGP  No results found for: GPT  No results found for: HBA1C, HGBE8, HBA1CPOC, HBA1CEXT      All lab results and radiological studies were reviewed and discussed with the patient.    ASSESSMENT/PLAN:    Diagnoses and all orders for this visit:    1. Cigarette smoker  -     buPROPion SR (WELLBUTRIN SR) 100 mg SR tablet; Take 1 Tab by mouth two (2) times a day  The patient was counseled on the dangers of tobacco use, and was advised to quit.  Reviewed strategies to maximize success, including pharmacotherapy (wellbutrin).    2. Gastroesophageal reflux disease with esophagitis  -     lansoprazole (PREVACID) 30 mg capsule; Take 1 Cap by mouth Daily (before breakfast).  Follow-up with gastroenterology as scheduled.     Patient verbalized understanding and agreement with the plan.    Patient was given an after-visit summary.    Follow-up in 1 month or sooner if worsening symptoms.    A total of 25 minutes were spent face-to-face with the patient during this encounter and over half of that time was spent on counseling and coordination of care. We discussed in depth the importance of smoking cessation. I also educated the patient about GERD.          Aibhlinn Kalmar L. Mat Stuard, PA-C  [facility]  [address], [place] [address]  [place] [phone]

## 2018-03-17 NOTE — Progress Notes (Signed)
Follow Up Visit Note    Chief Complaint   Patient presents with   ??? Hypertension     follow up   ??? Thyroid Problem     f/u from ent   ??? Epigastric Pain     f/u from gastro   ??? Hiatal Hernia     f/u from gastro       HPI:  Vicki Greene is a 56 y.o. African American female  has a past medical history of Asthma and Hypertension.  is here for the above complaint(s).     Hiatal Hernia  Dr. Fredric Dine does not plan to do anything with hiatal hernia at this time. Plan is to eat high fiber diet and to sit up for hours after eating. She was also put on omeprazole 20mg  daily. She has been taking this medication as prescribed.     Tobacco Abuse  Patient has history of cigarette smoking. Patient is a current every day smoker that she smokes 1.0 pack per day for the past 30 years. She really would like to quit smoking. She has tried other options in the past, including nicotine replacements. She would like to try a medication to help with smoking cessation at this time.       Review of Systems   Constitutional: Negative for chills, fever, malaise/fatigue and weight loss.   Eyes: Negative for blurred vision, double vision and pain.   Respiratory: Negative for cough, sputum production, shortness of breath and wheezing.    Cardiovascular: Negative for chest pain, palpitations, orthopnea, claudication and leg swelling.   Gastrointestinal: Negative for abdominal pain, constipation, diarrhea, nausea and vomiting.   Genitourinary: Negative for dysuria, frequency and urgency.   Neurological: Negative for dizziness, tingling and headaches.       Current Outpatient Medications   Medication Sig   ??? omeprazole (PRILOSEC) 20 mg capsule take 1 capsule by mouth ONCE A DAY   ??? atenolol (TENORMIN) 50 mg tablet Take 1 Tab by mouth daily.   ??? amLODIPine (NORVASC) 5 mg tablet Take 1 Tab by mouth daily.   ??? hydroCHLOROthiazide (HYDRODIURIL) 25 mg tablet Take 1 Tab by mouth daily.   ??? folic acid/multivit-min/lutein (CENTRUM SILVER PO) Take  by mouth.      No current facility-administered medications for this visit.      Health Maintenance   Topic Date Due   ??? Hepatitis C Screening  08-27-1961   ??? Pneumococcal 0-64 years (1 of 1 - PPSV23) 06/23/1968   ??? DTaP/Tdap/Td series (1 - Tdap) 06/24/1983   ??? PAP AKA CERVICAL CYTOLOGY  06/24/1983   ??? Shingrix Vaccine Age 81> (1 of 2) 06/23/2012   ??? FOBT Q 1 YEAR AGE 22-75  06/23/2012   ??? Influenza Age 63 to Adult  05/26/2018   ??? BREAST CANCER SCRN MAMMOGRAM  12/25/2019       There is no immunization history on file for this patient.    Allergies and Medications: Reviewed and updated in EMR.     Past Medical History:   Diagnosis Date   ??? Asthma    ??? Hypertension        Surgical History: Reviewed and updated in EMR as appropriate.  Social History: Reviewed and updated in EMR as appropriate.  Family History: Reviewed and updated in EMR as appropriate.    OBJECTIVE:   Visit Vitals  BP 114/77   Pulse 74   Temp 97.4 ??F (36.3 ??C) (Oral)   Resp 18   Ht 5\' 7"  (  1.702 m)   Wt 213 lb (96.6 kg)   SpO2 100%   BMI 33.36 kg/m??        Physical Exam   Constitutional: She is oriented to person, place, and time and well-developed, well-nourished, and in no distress. No distress.   Neck: Normal range of motion. Neck supple. No thyromegaly present.   Cardiovascular: Normal rate, regular rhythm, normal heart sounds and intact distal pulses. Exam reveals no gallop and no friction rub.   No murmur heard.  Pulmonary/Chest: Effort normal and breath sounds normal. No respiratory distress. She has no wheezes. She has no rales. She exhibits no tenderness.   Lymphadenopathy:     She has no cervical adenopathy.   Neurological: She is alert and oriented to person, place, and time.   Skin: Skin is warm and dry. She is not diaphoretic.   Psychiatric: Mood, memory, affect and judgment normal.   Vitals reviewed.    LABS/RADIOLOGICAL TESTS:  Lab Results   Component Value Date/Time    WBC 6.6 12/24/2017 08:57 AM    HGB 13.4 12/24/2017 08:57 AM     HCT 41.6 12/24/2017 08:57 AM    PLATELET 245 12/24/2017 08:57 AM     Lab Results   Component Value Date/Time    Sodium 139 12/24/2017 08:57 AM    Potassium 3.9 12/24/2017 08:57 AM    Chloride 106 12/24/2017 08:57 AM    CO2 28 12/24/2017 08:57 AM    Glucose 87 12/24/2017 08:57 AM    BUN 11 12/24/2017 08:57 AM    Creatinine 1.0 12/24/2017 08:57 AM     No results found for: CHOL, CHOLX, CHLST, CHOLV, HDL, LDL, LDLC, DLDLP, TGLX, TRIGL, TRIGP  No results found for: GPT  No results found for: HBA1C, HGBE8, HBA1CPOC, HBA1CEXT      All lab results and radiological studies were reviewed and discussed with the patient.    ASSESSMENT/PLAN:    Diagnoses and all orders for this visit:    1. Cigarette smoker  -     buPROPion SR (WELLBUTRIN SR) 100 mg SR tablet; Take 1 Tab by mouth two (2) times a day  The patient was counseled on the dangers of tobacco use, and was advised to quit.  Reviewed strategies to maximize success, including pharmacotherapy (wellbutrin).    2. Gastroesophageal reflux disease with esophagitis  -     lansoprazole (PREVACID) 30 mg capsule; Take 1 Cap by mouth Daily (before breakfast).  Follow-up with gastroenterology as scheduled.     Patient verbalized understanding and agreement with the plan.    Patient was given an after-visit summary.    Follow-up in 1 month or sooner if worsening symptoms.    A total of 25 minutes were spent face-to-face with the patient during this encounter and over half of that time was spent on counseling and coordination of care. We discussed in depth the importance of smoking cessation. I also educated the patient about GERD.          Barney Russomanno L. Sadiq Mccauley, PA-C  [facility]  [place], [place] [address]  [place] [phone]

## 2018-03-17 NOTE — Progress Notes (Signed)
Chief Complaint   Patient presents with   ??? Hypertension     follow up   ??? Thyroid Problem     f/u from ent   ??? Epigastric Pain     f/u from gastro   ??? Hiatal Hernia     f/u from gastro       .Marland Kitchen1. Have you been to the ER, urgent care clinic since your last visit?  Hospitalized since your last visit?Yes Reason for visit: Thyroid    2. Have you seen or consulted any other health care providers outside of the [facility] since your last visit?  Include any pap smears or colon screening. Yes Where: ENT

## 2018-03-25 ENCOUNTER — Encounter

## 2018-03-25 NOTE — Telephone Encounter (Signed)
Requested Prescriptions     Pending Prescriptions Disp Refills   ??? buPROPion SR (WELLBUTRIN SR) 100 mg SR tablet 180 Tab 1     Sig: Take 1 Tab by mouth two (2) times a day.   ??? lansoprazole (PREVACID) 30 mg capsule 90 Cap 0     Sig: Take 1 Cap by mouth Daily (before breakfast).     *Pt was upset her provider had not put these med refills in on day of appt last week.

## 2018-03-28 NOTE — Telephone Encounter (Signed)
As noted in her chart, the medications were sent on 03/17/2018 with 90 days supply to the pharmacy that was in the chart. Please advise.    Marymargaret Kirker L. Jaiyla Granados, PA-C  [facility]  [address]  [place] [address]

## 2018-03-28 NOTE — Telephone Encounter (Signed)
Called patient to inform her that medications were sent to the pharmacy on 03/17/18.  Patient became very upset and stated that [facility] received the RX but that medications need a PA.  Patient states, "Your followup shouldn't be with me.  You need to be calling [facility]."  Informed patient that we did not receive anything from them stating anything was needed.  Patient became upset again and said "cant you just call them.  I have been without my medications for 2 weeks."      Attempted to contact [facility] but I was on hold for awhile and had to hang up.

## 2018-04-23 ENCOUNTER — Encounter

## 2018-04-26 MED ORDER — AMLODIPINE 5 MG TAB
5 mg | ORAL_TABLET | ORAL | 0 refills | Status: DC
Start: 2018-04-26 — End: 2018-07-08

## 2018-04-26 MED ORDER — ATENOLOL 50 MG TAB
50 mg | ORAL_TABLET | ORAL | 0 refills | Status: DC
Start: 2018-04-26 — End: 2018-07-08

## 2018-04-26 MED ORDER — HYDROCHLOROTHIAZIDE 25 MG TAB
25 mg | ORAL_TABLET | ORAL | 0 refills | Status: DC
Start: 2018-04-26 — End: 2018-07-08

## 2018-07-08 ENCOUNTER — Encounter

## 2018-07-08 NOTE — Telephone Encounter (Signed)
Pt stated pharmacy has previously requested these refills by fax. Pt has been out for 2 weeks.    Requested Prescriptions     Pending Prescriptions Disp Refills   ??? amLODIPine (NORVASC) 5 mg tablet 90 Tab 0   ??? atenolol (TENORMIN) 50 mg tablet 90 Tab 0   ??? hydroCHLOROthiazide (HYDRODIURIL) 25 mg tablet 90 Tab 0

## 2018-07-09 MED ORDER — AMLODIPINE 5 MG TAB
5 mg | ORAL_TABLET | ORAL | 0 refills | Status: DC
Start: 2018-07-09 — End: 2018-09-30

## 2018-07-09 MED ORDER — HYDROCHLOROTHIAZIDE 25 MG TAB
25 mg | ORAL_TABLET | ORAL | 0 refills | Status: DC
Start: 2018-07-09 — End: 2018-09-30

## 2018-07-09 MED ORDER — ATENOLOL 50 MG TAB
50 mg | ORAL_TABLET | ORAL | 0 refills | Status: DC
Start: 2018-07-09 — End: 2018-09-30

## 2018-07-09 NOTE — Telephone Encounter (Signed)
Refills were being sent to previous provider, Dr. Georgiann Mccoy. Just received forwarded refill request.     Pierce Biagini L. Iesha Summerhill, PA-C  [facility]  [address]  [place] [address]

## 2018-09-30 ENCOUNTER — Encounter

## 2018-09-30 NOTE — Telephone Encounter (Signed)
Requested Prescriptions     Pending Prescriptions Disp Refills   ??? amLODIPine (NORVASC) 5 mg tablet 90 Tab 0     Sig: TAKE 1 TABLET DAILY   ??? hydroCHLOROthiazide (HYDRODIURIL) 25 mg tablet 90 Tab 0     Sig: TAKE 1 TABLET DAILY   ??? atenolol (TENORMIN) 50 mg tablet 90 Tab 0     Sig: TAKE 1 TABLET DAILY

## 2018-10-03 MED ORDER — ATENOLOL 50 MG TAB
50 mg | ORAL_TABLET | ORAL | 0 refills | Status: AC
Start: 2018-10-03 — End: ?

## 2018-10-03 MED ORDER — HYDROCHLOROTHIAZIDE 25 MG TAB
25 mg | ORAL_TABLET | ORAL | 0 refills | Status: AC
Start: 2018-10-03 — End: ?

## 2018-10-03 MED ORDER — AMLODIPINE 5 MG TAB
5 mg | ORAL_TABLET | ORAL | 0 refills | Status: AC
Start: 2018-10-03 — End: ?

## 2018-10-03 NOTE — Telephone Encounter (Signed)
Needs apt. Last RF until patient comes in. Has not been in since 02/2018.    Kendan Cornforth L. Jasimine Simms, PA-C  [facility]  [address]  [place] [address]

## 2018-11-10 ENCOUNTER — Other Ambulatory Visit: Payer: Self-pay

## 2018-11-10 ENCOUNTER — Encounter (HOSPITAL_BASED_OUTPATIENT_CLINIC_OR_DEPARTMENT_OTHER): Payer: Self-pay | Admitting: *Deleted

## 2018-11-10 ENCOUNTER — Emergency Department (HOSPITAL_BASED_OUTPATIENT_CLINIC_OR_DEPARTMENT_OTHER)
Admission: EM | Admit: 2018-11-10 | Discharge: 2018-11-10 | Disposition: A | Discharge status: Home / Self Care | Payer: BLUE CROSS/BLUE SHIELD | Attending: Emergency Medicine | Admitting: Emergency Medicine

## 2018-11-10 DIAGNOSIS — Z79899 Other long term (current) drug therapy: Secondary | ICD-10-CM | POA: Insufficient documentation

## 2018-11-10 DIAGNOSIS — N3000 Acute cystitis without hematuria: Secondary | ICD-10-CM | POA: Diagnosis not present

## 2018-11-10 DIAGNOSIS — I10 Essential (primary) hypertension: Secondary | ICD-10-CM | POA: Diagnosis not present

## 2018-11-10 DIAGNOSIS — F17200 Nicotine dependence, unspecified, uncomplicated: Secondary | ICD-10-CM | POA: Insufficient documentation

## 2018-11-10 DIAGNOSIS — N760 Acute vaginitis: Secondary | ICD-10-CM | POA: Diagnosis not present

## 2018-11-10 DIAGNOSIS — B9689 Other specified bacterial agents as the cause of diseases classified elsewhere: Secondary | ICD-10-CM

## 2018-11-10 DIAGNOSIS — N898 Other specified noninflammatory disorders of vagina: Secondary | ICD-10-CM | POA: Diagnosis present

## 2018-11-10 HISTORY — DX: Essential (primary) hypertension: I10

## 2018-11-10 LAB — PREGNANCY, URINE: Preg Test, Ur: NEGATIVE

## 2018-11-10 LAB — URINALYSIS, MICROSCOPIC (REFLEX)

## 2018-11-10 LAB — URINALYSIS, ROUTINE W REFLEX MICROSCOPIC
Bilirubin Urine: NEGATIVE
Glucose, UA: NEGATIVE mg/dL
Ketones, ur: NEGATIVE mg/dL
Nitrite: NEGATIVE
Protein, ur: NEGATIVE mg/dL
Specific Gravity, Urine: 1.015 (ref 1.005–1.030)
pH: 6.5 (ref 5.0–8.0)

## 2018-11-10 LAB — WET PREP, GENITAL
Sperm: NONE SEEN
Trich, Wet Prep: NONE SEEN
Yeast Wet Prep HPF POC: NONE SEEN

## 2018-11-10 MED ORDER — METRONIDAZOLE 500 MG PO TABS
2000.0000 mg | ORAL_TABLET | Freq: Once | ORAL | Status: AC
  Administered 2018-11-10: 2000 mg via ORAL
  Filled 2018-11-10: qty 4

## 2018-11-10 MED ORDER — FOSFOMYCIN TROMETHAMINE 3 G PO PACK
3.0000 g | PACK | Freq: Once | ORAL | Status: AC
  Administered 2018-11-10: 3 g via ORAL

## 2018-11-10 NOTE — ED Provider Notes (Signed)
[facility] EMERGENCY DEPARTMENT Provider Note   CSN: [id number] Arrival date & time: 11/10/18  0707     History   Chief Complaint Chief Complaint  Patient presents with  . Vaginal Discharge    HPI Ashley Johnston is a 58 y.o. female.  58 yo F with a chief complaint of vaginal discharge.  Going on for 3 weeks.  Also states that her urine has been cloudy and foul-smelling.  Denies pain with urination denies abdominal pain denies fevers denies vomiting.  Says she had her urine smell that way previously and it was urinary tract infection.  Has tried oral rehydration without improvement.  The history is provided by the patient.  Vaginal Discharge  Associated symptoms: no dysuria, no fever, no nausea and no vomiting   Illness  Severity:  Mild Onset quality:  Gradual Duration:  3 weeks Timing:  Constant Progression:  Unchanged Chronicity:  Recurrent Associated symptoms: no chest pain, no congestion, no fever, no headaches, no myalgias, no nausea, no rhinorrhea, no shortness of breath, no vomiting and no wheezing     Past Medical History:  Diagnosis Date  . Hypertension     There are no active problems to display for this patient.   History reviewed. No pertinent surgical history.   OB History   No obstetric history on file.      Home Medications    Prior to Admission medications   Medication Sig Start Date End Date Taking? Authorizing Provider  amLODipine (NORVASC) 2.5 MG tablet Take 2.5 mg by mouth daily.   Yes [provider]  ATENOLOL PO Take by mouth.   Yes [provider]  buPROPion (WELLBUTRIN) 75 MG tablet Take 75 mg by mouth 2 (two) times daily.   Yes [provider]  hydrochlorothiazide (HYDRODIURIL) 25 MG tablet Take 25 mg by mouth daily.   Yes [provider]    Family History History reviewed. No pertinent family history.  Social History Social History   Tobacco Use  . Smoking status: Current Every Day  Smoker  . Smokeless tobacco: Never Used  Substance Use Topics  . Alcohol use: Not on file  . Drug use: Not on file     Allergies   Penicillins   Review of Systems Review of Systems  Constitutional: Negative for chills and fever.  HENT: Negative for congestion and rhinorrhea.   Eyes: Negative for redness and visual disturbance.  Respiratory: Negative for shortness of breath and wheezing.   Cardiovascular: Negative for chest pain and palpitations.  Gastrointestinal: Negative for nausea and vomiting.  Genitourinary: Positive for vaginal discharge. Negative for dysuria and urgency.  Musculoskeletal: Negative for arthralgias and myalgias.  Skin: Negative for pallor and wound.  Neurological: Negative for dizziness and headaches.     Physical Exam Updated Vital Signs BP (!) 181/95 (BP Location: Right Arm)   Pulse 80   Temp 98.1 F (36.7 C) (Oral)   Resp 18   Ht 5\' 7"  (1.702 m)   Wt 96.2 kg   SpO2 100%   BMI 33.20 kg/m   Physical Exam Vitals signs and nursing note reviewed.  Constitutional:      General: She is not in acute distress.    Appearance: She is well-developed. She is not diaphoretic.  HENT:     Head: Normocephalic and atraumatic.  Eyes:     Pupils: Pupils are equal, round, and reactive to light.  Neck:     Musculoskeletal: Normal range of motion and neck supple.  Cardiovascular:     Rate and Rhythm: Normal rate and regular rhythm.     Heart sounds: No murmur. No friction rub. No gallop.   Pulmonary:     Effort: Pulmonary effort is normal.     Breath sounds: No wheezing or rales.  Abdominal:     General: There is no distension.     Palpations: Abdomen is soft.     Tenderness: There is no abdominal tenderness.  Musculoskeletal:        General: No tenderness.  Skin:    General: Skin is warm and dry.  Neurological:     Mental Status: She is alert and oriented to person, place, and time.  Psychiatric:        Behavior: Behavior normal.      ED  Treatments / Results  Labs (all labs ordered are listed, but only abnormal results are displayed) Labs Reviewed  WET PREP, GENITAL - Abnormal; Notable for the following components:      Result Value   Clue Cells Wet Prep HPF POC PRESENT (*)    WBC, Wet Prep HPF POC MANY (*)    All other components within normal limits  URINALYSIS, ROUTINE W REFLEX MICROSCOPIC - Abnormal; Notable for the following components:   Hgb urine dipstick SMALL (*)    Leukocytes, UA LARGE (*)    All other components within normal limits  URINALYSIS, MICROSCOPIC (REFLEX) - Abnormal; Notable for the following components:   Bacteria, UA MANY (*)    All other components within normal limits  PREGNANCY, URINE  GC/CHLAMYDIA PROBE AMP ([facility]) NOT AT [facility]    EKG None  Radiology No results found.  Procedures Procedures (including critical care time)  Medications Ordered in ED Medications  fosfomycin (MONUROL) packet 3 g (has no administration in time range)  metroNIDAZOLE (FLAGYL) tablet 2,000 mg (has no administration in time range)     Initial Impression / Assessment and Plan / ED Course  I have reviewed the triage vital signs and the nursing notes.  Pertinent labs & imaging results that were available during my care of the patient were reviewed by me and considered in my medical decision making (see chart for details).     58 yo F with vaginal discharge.  Wet prep, gc, ua.   UA with too numerous to count bacteria and large leukocyte esterase.  Will treat with fosfomycin.  Clue cells on wet prep will give a dose of Flagyl.  9:06 AM:  I have discussed the diagnosis/risks/treatment options with the patient and believe the pt to be eligible for discharge home to follow-up with PCP. We also discussed returning to the ED immediately if new or worsening sx occur. We discussed the sx which are most concerning (e.g., sudden worsening pain, fever, inability to tolerate by mouth) that necessitate immediate  return. Medications administered to the patient during their visit and any new prescriptions provided to the patient are listed below.  Medications given during this visit Medications  fosfomycin (MONUROL) packet 3 g (has no administration in time range)  metroNIDAZOLE (FLAGYL) tablet 2,000 mg (has no administration in time range)     The patient appears reasonably screen and/or stabilized for discharge and I doubt any other medical condition or other [facility] requiring further screening, evaluation, or treatment in the ED at this time prior to discharge.    Final Clinical Impressions(s) / ED Diagnoses   Final diagnoses:  BV (bacterial vaginosis)  Acute cystitis without hematuria  ED Discharge Orders    None       Melene Plan, [place] 11/10/18 5038

## 2018-11-10 NOTE — ED Triage Notes (Signed)
Pt reports "cloudy urine" and clear vag discharge x 2-3 weeks. Denies any c/o at this time. Does not have ob/gyn.

## 2018-11-10 NOTE — Discharge Instructions (Addendum)
Follow up with your PCP. Follow up with GYN.   Your urine shows lots of bacteria in it with white blood cells.  This may be representative of infection.  We will give you a one-time dose of antibiotics which should take care of it.  You also have bacterial vaginosis.  One-time dose of Flagyl should take care of 9.

## 2018-11-10 NOTE — ED Notes (Signed)
ED Provider at bedside. 

## 2018-11-11 LAB — GC/CHLAMYDIA PROBE AMP (~~LOC~~) NOT AT ARMC
CHLAMYDIA, DNA PROBE: NEGATIVE
Neisseria Gonorrhea: NEGATIVE

## 2018-12-27 NOTE — Progress Notes (Signed)
[name]  [address] [place] [address]         Dear Ms. Starks,  RE: Your low-dose CT lung screening is due NOW.    I have attempted to call you three times to schedule your CT lung scan. It is important to continue annual lung cancer screening for high risk patients. Please call me so I can schedule your next scan.    I am always here to answer any questions you may have. If you have further questions about your test, questions about this letter or difficulty contacting your health care provider, please don't hesitate to call me at [phone].    Sincerely,      Schuyler Amor, R.N., B.S.N.  Thoracic and Lung Health Nurse Navigator  Phone: [phone] Fax: [phone]  12/27/2018

## 2018-12-27 NOTE — Progress Notes (Signed)
Vicki Greene  [address] [place] [address]         Dear Ms. Romer,  RE: Your low-dose CT lung screening is due NOW.    I have attempted to call you three times to schedule your CT lung scan. It is important to continue annual lung cancer screening for high risk patients. Please call me so I can schedule your next scan.    I am always here to answer any questions you may have. If you have further questions about your test, questions about this letter or difficulty contacting your health care provider, please don't hesitate to call me at [phone].    Sincerely,      Schuyler Amor, R.N., B.S.N.  Thoracic and Lung Health Nurse Navigator  Phone: [phone] Fax: [phone]  12/27/2018

## 2019-03-17 ENCOUNTER — Inpatient Hospital Stay: Admit: 2019-03-17 | Payer: BLUE CROSS/BLUE SHIELD | Primary: Physician Assistant

## 2019-03-17 DIAGNOSIS — I10 Essential (primary) hypertension: Secondary | ICD-10-CM

## 2019-03-18 LAB — CBC WITH AUTOMATED DIFF
BASOPHILS: 0.5 % (ref 0–3)
EOSINOPHILS: 2.5 % (ref 0–5)
HCT: 38.2 % (ref 37.0–50.0)
HGB: 12.3 gm/dl — ABNORMAL LOW (ref 13.0–17.2)
IMMATURE GRANULOCYTES: 0.2 % (ref 0.0–3.0)
LYMPHOCYTES: 45.3 % (ref 28–48)
MCH: 31.1 pg (ref 25.4–34.6)
MCHC: 32.2 gm/dl (ref 30.0–36.0)
MCV: 96.7 fL (ref 80.0–98.0)
MONOCYTES: 8.2 % (ref 1–13)
MPV: 10.3 fL — ABNORMAL HIGH (ref 6.0–10.0)
NEUTROPHILS: 43.3 % (ref 34–64)
NRBC: 0 (ref 0–0)
PLATELET: 264 10*3/uL (ref 140–450)
RBC: 3.95 M/uL (ref 3.60–5.20)
RDW-SD: 45.2 (ref 36.4–46.3)
WBC: 6.5 10*3/uL (ref 4.0–11.0)

## 2019-03-18 LAB — T4, FREE
FREE T4, FT4T: 0.92 ng/dl (ref 0.76–1.46)
Free T4: 0.92 ng/dl (ref 0.76–1.46)

## 2019-03-18 LAB — METABOLIC PANEL, COMPREHENSIVE
ALT (SGPT): 16 U/L (ref 12–78)
AST (SGOT): 11 U/L — ABNORMAL LOW (ref 15–37)
Albumin: 3.5 gm/dl (ref 3.4–5.0)
Alk. phosphatase: 143 U/L — ABNORMAL HIGH (ref 45–117)
Anion gap: 4 mmol/L — ABNORMAL LOW (ref 5–15)
BUN: 12 mg/dl (ref 7–25)
Bilirubin, total: 0.3 mg/dl (ref 0.2–1.0)
CO2: 28 mEq/L (ref 21–32)
Calcium: 9.2 mg/dl (ref 8.5–10.1)
Chloride: 108 mEq/L — ABNORMAL HIGH (ref 98–107)
Creatinine: 1 mg/dl (ref 0.6–1.3)
GFR est AA: >60
GFR est non-AA: >60
Glucose: 80 mg/dl (ref 74–106)
Potassium: 3.8 mEq/L (ref 3.5–5.1)
Protein, total: 6.7 gm/dl (ref 6.4–8.2)
Sodium: 140 mEq/L (ref 136–145)

## 2019-03-18 LAB — VITAMIN B12
VITAMIN B12: 473 pg/ml (ref 193–986)
Vitamin B12: 473 pg/ml (ref 193–986)

## 2019-03-18 LAB — VITAMIN D, 25 HYDROXY: Vitamin D, 25-OH, Total: 21.6 ng/ml — ABNORMAL LOW (ref 30.0–100.0)

## 2019-03-18 LAB — TSH 3RD GENERATION
TSH: 1.15 u[IU]/mL (ref 0.358–3.740)
TSH: 1.15 u[IU]/mL (ref 0.358–3.740)

## 2019-03-18 LAB — COMPREHENSIVE METABOLIC PANEL
ALT: 16 U/L (ref 12–78)
AST: 11 U/L — ABNORMAL LOW (ref 15–37)
Albumin: 3.5 gm/dl (ref 3.4–5.0)
Alkaline Phosphatase: 143 U/L — ABNORMAL HIGH (ref 45–117)
Anion Gap: 4 mmol/L — ABNORMAL LOW (ref 5–15)
BUN: 12 mg/dl (ref 7–25)
CO2: 28 mEq/L (ref 21–32)
Calcium: 9.2 mg/dl (ref 8.5–10.1)
Chloride: 108 mEq/L — ABNORMAL HIGH (ref 98–107)
Creatinine: 1 mg/dl (ref 0.6–1.3)
EGFR IF NonAfrican American: >60
GFR African American: >60
Glucose: 80 mg/dl (ref 74–106)
Potassium: 3.8 mEq/L (ref 3.5–5.1)
Sodium: 140 mEq/L (ref 136–145)
Total Bilirubin: 0.3 mg/dl (ref 0.2–1.0)
Total Protein: 6.7 gm/dl (ref 6.4–8.2)

## 2019-03-18 LAB — CBC WITH AUTO DIFFERENTIAL
Basophils %: 0.5 % (ref 0–3)
Eosinophils %: 2.5 % (ref 0–5)
Hematocrit: 38.2 % (ref 37.0–50.0)
Hemoglobin: 12.3 gm/dl — ABNORMAL LOW (ref 13.0–17.2)
Immature Granulocytes: 0.2 % (ref 0.0–3.0)
Lymphocytes %: 45.3 % (ref 28–48)
MCH: 31.1 pg (ref 25.4–34.6)
MCHC: 32.2 gm/dl (ref 30.0–36.0)
MCV: 96.7 fL (ref 80.0–98.0)
MPV: 10.3 fL — ABNORMAL HIGH (ref 6.0–10.0)
Monocytes %: 8.2 % (ref 1–13)
Neutrophils %: 43.3 % (ref 34–64)
Nucleated RBCs: 0 (ref 0–0)
Platelets: 264 10*3/uL (ref 140–450)
RBC: 3.95 M/uL (ref 3.60–5.20)
RDW-SD: 45.2 (ref 36.4–46.3)
WBC: 6.5 10*3/uL (ref 4.0–11.0)

## 2019-03-18 LAB — VITAMIN D 25 HYDROXY: Vitamin D, 25-OH, Total: 21.6 ng/ml — ABNORMAL LOW (ref 30.0–100.0)

## 2019-03-29 DIAGNOSIS — I10 Essential (primary) hypertension: Secondary | ICD-10-CM

## 2019-03-30 ENCOUNTER — Inpatient Hospital Stay: Admit: 2019-03-30 | Payer: BLUE CROSS/BLUE SHIELD | Primary: Physician Assistant

## 2019-03-30 LAB — CBC WITH AUTOMATED DIFF
BASOPHILS: 0.7 % (ref 0–3)
EOSINOPHILS: 2.9 % (ref 0–5)
HCT: 40.4 % (ref 37.0–50.0)
HGB: 13 gm/dl (ref 13.0–17.2)
IMMATURE GRANULOCYTES: 0.2 % (ref 0.0–3.0)
LYMPHOCYTES: 49.5 % — ABNORMAL HIGH (ref 28–48)
MCH: 32 pg (ref 25.4–34.6)
MCHC: 32.2 gm/dl (ref 30.0–36.0)
MCV: 99.5 fL — ABNORMAL HIGH (ref 80.0–98.0)
MONOCYTES: 9.6 % (ref 1–13)
MPV: 10 fL (ref 6.0–10.0)
NEUTROPHILS: 37.1 % (ref 34–64)
NRBC: 0 (ref 0–0)
PLATELET: 272 10*3/uL (ref 140–450)
RBC: 4.06 M/uL (ref 3.60–5.20)
RDW-SD: 48.3 — ABNORMAL HIGH (ref 36.4–46.3)
WBC: 6.1 10*3/uL (ref 4.0–11.0)

## 2019-03-30 LAB — HEMOGLOBIN A1C W/O EAG
Hemoglobin A1C: 5.4 % (ref 4.2–5.6)
Hemoglobin A1c: 5.4 % (ref 4.2–5.6)

## 2019-03-30 LAB — TSH 3RD GENERATION
TSH: 1.22 u[IU]/mL (ref 0.358–3.740)
TSH: 1.22 u[IU]/mL (ref 0.358–3.740)

## 2019-03-30 LAB — MICROALBUMIN, UR, RAND W/ MICROALB/CREAT RATIO
Creatinine, urine random: 230 mg/dl — ABNORMAL HIGH (ref 30.0–125.0)
Microalbumin,urine random: 823 mg/L — ABNORMAL HIGH (ref 0.0–29.9)
Microalbumin/Creat ratio (mg/g creat): 358 mg/g — ABNORMAL HIGH (ref 0–30)

## 2019-03-30 LAB — T4, FREE
FREE T4, FT4T: 0.91 ng/dl (ref 0.76–1.46)
Free T4: 0.91 ng/dl (ref 0.76–1.46)

## 2019-03-30 LAB — CBC WITH AUTO DIFFERENTIAL
Basophils %: 0.7 % (ref 0–3)
Eosinophils %: 2.9 % (ref 0–5)
Hematocrit: 40.4 % (ref 37.0–50.0)
Hemoglobin: 13 gm/dl (ref 13.0–17.2)
Immature Granulocytes: 0.2 % (ref 0.0–3.0)
Lymphocytes %: 49.5 % — ABNORMAL HIGH (ref 28–48)
MCH: 32 pg (ref 25.4–34.6)
MCHC: 32.2 gm/dl (ref 30.0–36.0)
MCV: 99.5 fL — ABNORMAL HIGH (ref 80.0–98.0)
MPV: 10 fL (ref 6.0–10.0)
Monocytes %: 9.6 % (ref 1–13)
Neutrophils %: 37.1 % (ref 34–64)
Nucleated RBCs: 0 (ref 0–0)
Platelets: 272 10*3/uL (ref 140–450)
RBC: 4.06 M/uL (ref 3.60–5.20)
RDW-SD: 48.3 — ABNORMAL HIGH (ref 36.4–46.3)
WBC: 6.1 10*3/uL (ref 4.0–11.0)

## 2019-03-30 LAB — MICROALBUMIN / CREATININE URINE RATIO
CREATININE, URINE RANDOM: 230 mg/dl — ABNORMAL HIGH (ref 30.0–125.0)
Microalb, Ur: 823 mg/L — ABNORMAL HIGH (ref 0.0–29.9)
Microalbumin Creatinine Ratio: 358 mg/g — ABNORMAL HIGH (ref 0–30)

## 2020-05-29 DIAGNOSIS — I10 Essential (primary) hypertension: Secondary | ICD-10-CM

## 2020-05-30 ENCOUNTER — Inpatient Hospital Stay: Admit: 2020-05-30 | Payer: BLUE CROSS/BLUE SHIELD | Primary: Physician Assistant

## 2020-05-30 LAB — METABOLIC PANEL, COMPREHENSIVE
ALT (SGPT): 21 U/L (ref 12–78)
AST (SGOT): 14 U/L — ABNORMAL LOW (ref 15–37)
Albumin: 3.4 gm/dl (ref 3.4–5.0)
Alk. phosphatase: 123 U/L — ABNORMAL HIGH (ref 45–117)
Anion gap: 8 mmol/L (ref 5–15)
BUN: 12 mg/dl (ref 7–25)
Bilirubin, total: 0.5 mg/dl (ref 0.2–1.0)
CO2: 26 mEq/L (ref 21–32)
Calcium: 8.9 mg/dl (ref 8.5–10.1)
Chloride: 108 mEq/L — ABNORMAL HIGH (ref 98–107)
Creatinine: 1 mg/dl (ref 0.6–1.3)
GFR est AA: >60
GFR est non-AA: >60
Glucose: 68 mg/dl — ABNORMAL LOW (ref 74–106)
Potassium: 4 mEq/L (ref 3.5–5.1)
Protein, total: 7.2 gm/dl (ref 6.4–8.2)
Sodium: 142 mEq/L (ref 136–145)

## 2020-05-30 LAB — CBC WITH AUTOMATED DIFF
BASOPHILS: 0.8 % (ref 0–3)
EOSINOPHILS: 2.6 % (ref 0–5)
HCT: 38.8 % (ref 37.0–50.0)
HGB: 12.5 gm/dl — ABNORMAL LOW (ref 13.0–17.2)
IMMATURE GRANULOCYTES: 0.2 % (ref 0.0–3.0)
LYMPHOCYTES: 46 % (ref 28–48)
MCH: 31.3 pg (ref 25.4–34.6)
MCHC: 32.2 gm/dl (ref 30.0–36.0)
MCV: 97.2 fL (ref 80.0–98.0)
MONOCYTES: 6.7 % (ref 1–13)
MPV: 10.1 fL — ABNORMAL HIGH (ref 6.0–10.0)
NEUTROPHILS: 43.7 % (ref 34–64)
NRBC: 0 (ref 0–0)
PLATELET: 279 10*3/uL (ref 140–450)
RBC: 3.99 M/uL (ref 3.60–5.20)
RDW-SD: 48.2 — ABNORMAL HIGH (ref 36.4–46.3)
WBC: 6.2 10*3/uL (ref 4.0–11.0)

## 2020-05-30 LAB — HEPATITIS C AB
Hepatitis C virus Ab: NONREACTIVE
Signal to Cutoff (Hep C): 0

## 2020-05-30 LAB — MICROALBUMIN, UR, RAND W/ MICROALB/CREAT RATIO
Creatinine, urine random: 191 mg/dl — ABNORMAL HIGH (ref 30.0–125.0)
Microalbumin,urine random: 7.9 mg/L (ref 0.0–29.9)
Microalbumin/Creat ratio (mg/g creat): 4 mg/g (ref 0–30)

## 2020-05-30 LAB — VITAMIN D, 25 HYDROXY: Vitamin D, 25-OH, Total: 34.6 ng/ml (ref 30.0–100.0)

## 2020-05-30 LAB — HEMOGLOBIN A1C W/O EAG
Hemoglobin A1C: 5.4 % (ref 4.2–5.6)
Hemoglobin A1c: 5.4 % (ref 4.2–5.6)

## 2020-05-30 LAB — HIV 1/2 RAPID SCREEN
HIV P24 ANTIGEN, RAPID, HIVP24AG: NONREACTIVE
HIV P24 ANTIGEN, RAPID: NONREACTIVE
HIV RAPID: NONREACTIVE
HIV Rapid: NONREACTIVE

## 2020-05-30 LAB — COMPREHENSIVE METABOLIC PANEL
ALT: 21 U/L (ref 12–78)
AST: 14 U/L — ABNORMAL LOW (ref 15–37)
Albumin: 3.4 gm/dl (ref 3.4–5.0)
Alkaline Phosphatase: 123 U/L — ABNORMAL HIGH (ref 45–117)
Anion Gap: 8 mmol/L (ref 5–15)
BUN: 12 mg/dl (ref 7–25)
CO2: 26 mEq/L (ref 21–32)
Calcium: 8.9 mg/dl (ref 8.5–10.1)
Chloride: 108 mEq/L — ABNORMAL HIGH (ref 98–107)
Creatinine: 1 mg/dl (ref 0.6–1.3)
EGFR IF NonAfrican American: >60
GFR African American: >60
Glucose: 68 mg/dl — ABNORMAL LOW (ref 74–106)
Potassium: 4 mEq/L (ref 3.5–5.1)
Sodium: 142 mEq/L (ref 136–145)
Total Bilirubin: 0.5 mg/dl (ref 0.2–1.0)
Total Protein: 7.2 gm/dl (ref 6.4–8.2)

## 2020-05-30 LAB — CBC WITH AUTO DIFFERENTIAL
Basophils %: 0.8 % (ref 0–3)
Eosinophils %: 2.6 % (ref 0–5)
Hematocrit: 38.8 % (ref 37.0–50.0)
Hemoglobin: 12.5 gm/dl — ABNORMAL LOW (ref 13.0–17.2)
Immature Granulocytes: 0.2 % (ref 0.0–3.0)
Lymphocytes %: 46 % (ref 28–48)
MCH: 31.3 pg (ref 25.4–34.6)
MCHC: 32.2 gm/dl (ref 30.0–36.0)
MCV: 97.2 fL (ref 80.0–98.0)
MPV: 10.1 fL — ABNORMAL HIGH (ref 6.0–10.0)
Monocytes %: 6.7 % (ref 1–13)
Neutrophils %: 43.7 % (ref 34–64)
Nucleated RBCs: 0 (ref 0–0)
Platelets: 279 10*3/uL (ref 140–450)
RBC: 3.99 M/uL (ref 3.60–5.20)
RDW-SD: 48.2 — ABNORMAL HIGH (ref 36.4–46.3)
WBC: 6.2 10*3/uL (ref 4.0–11.0)

## 2020-05-30 LAB — MICROALBUMIN / CREATININE URINE RATIO
CREATININE, URINE RANDOM: 191 mg/dl — ABNORMAL HIGH (ref 30.0–125.0)
Microalb, Ur: 7.9 mg/L (ref 0.0–29.9)
Microalbumin Creatinine Ratio: 4 mg/g (ref 0–30)

## 2020-05-30 LAB — VITAMIN D 25 HYDROXY: Vitamin D, 25-OH, Total: 34.6 ng/ml (ref 30.0–100.0)

## 2020-05-30 LAB — HEPATITIS C ANTIBODY
HCV Ab: NONREACTIVE
SIGNAL TO CUTOFF (HEP C): 0

## 2020-06-04 ENCOUNTER — Encounter

## 2020-12-11 ENCOUNTER — Inpatient Hospital Stay: Admit: 2020-12-11 | Payer: BLUE CROSS/BLUE SHIELD | Primary: Physician Assistant

## 2020-12-11 DIAGNOSIS — R55 Syncope and collapse: Secondary | ICD-10-CM

## 2020-12-11 LAB — METABOLIC PANEL, COMPREHENSIVE
ALT (SGPT): 20 U/L (ref 12–78)
AST (SGOT): 14 U/L — ABNORMAL LOW (ref 15–37)
Albumin: 3.5 gm/dl (ref 3.4–5.0)
Alk. phosphatase: 113 U/L (ref 45–117)
Anion gap: 6 mmol/L (ref 5–15)
BUN: 11 mg/dl (ref 7–25)
Bilirubin, total: 0.3 mg/dl (ref 0.2–1.0)
CO2: 28 mEq/L (ref 21–32)
Calcium: 9.5 mg/dl (ref 8.5–10.1)
Chloride: 107 mEq/L (ref 98–107)
Creatinine: 1 mg/dl (ref 0.6–1.3)
GFR est AA: >60
GFR est non-AA: >60
Glucose: 82 mg/dl (ref 74–106)
Potassium: 4.2 mEq/L (ref 3.5–5.1)
Protein, total: 7.2 gm/dl (ref 6.4–8.2)
Sodium: 141 mEq/L (ref 136–145)

## 2020-12-11 LAB — CBC WITH AUTOMATED DIFF
BASOPHILS: 0.6 % (ref 0–3)
EOSINOPHILS: 3.1 % (ref 0–5)
HCT: 42.2 % (ref 37.0–50.0)
HGB: 13.5 gm/dl (ref 13.0–17.2)
IMMATURE GRANULOCYTES: 0.2 % (ref 0.0–3.0)
LYMPHOCYTES: 52.1 % — ABNORMAL HIGH (ref 28–48)
MCH: 32.2 pg (ref 25.4–34.6)
MCHC: 32 gm/dl (ref 30.0–36.0)
MCV: 100.7 fL — ABNORMAL HIGH (ref 80.0–98.0)
MONOCYTES: 9.1 % (ref 1–13)
MPV: 10.4 fL — ABNORMAL HIGH (ref 6.0–10.0)
NEUTROPHILS: 34.9 % (ref 34–64)
NRBC: 0 (ref 0–0)
PLATELET: 244 10*3/uL (ref 140–450)
RBC: 4.19 M/uL (ref 3.60–5.20)
RDW-SD: 50.6 — ABNORMAL HIGH (ref 36.4–46.3)
WBC: 6.5 10*3/uL (ref 4.0–11.0)

## 2020-12-11 LAB — T4, FREE
FREE T4, FT4T: 0.86 ng/dl (ref 0.76–1.46)
Free T4: 0.86 ng/dl (ref 0.76–1.46)

## 2020-12-11 LAB — TSH 3RD GENERATION
TSH: 1.53 u[IU]/mL (ref 0.358–3.740)
TSH: 1.53 u[IU]/mL (ref 0.358–3.740)

## 2020-12-11 LAB — CBC WITH AUTO DIFFERENTIAL
Basophils %: 0.6 % (ref 0–3)
Eosinophils %: 3.1 % (ref 0–5)
Hematocrit: 42.2 % (ref 37.0–50.0)
Hemoglobin: 13.5 gm/dl (ref 13.0–17.2)
Immature Granulocytes: 0.2 % (ref 0.0–3.0)
Lymphocytes %: 52.1 % — ABNORMAL HIGH (ref 28–48)
MCH: 32.2 pg (ref 25.4–34.6)
MCHC: 32 gm/dl (ref 30.0–36.0)
MCV: 100.7 fL — ABNORMAL HIGH (ref 80.0–98.0)
MPV: 10.4 fL — ABNORMAL HIGH (ref 6.0–10.0)
Monocytes %: 9.1 % (ref 1–13)
Neutrophils %: 34.9 % (ref 34–64)
Nucleated RBCs: 0 (ref 0–0)
Platelets: 244 10*3/uL (ref 140–450)
RBC: 4.19 M/uL (ref 3.60–5.20)
RDW-SD: 50.6 — ABNORMAL HIGH (ref 36.4–46.3)
WBC: 6.5 10*3/uL (ref 4.0–11.0)

## 2020-12-11 LAB — COMPREHENSIVE METABOLIC PANEL
ALT: 20 U/L (ref 12–78)
AST: 14 U/L — ABNORMAL LOW (ref 15–37)
Albumin: 3.5 gm/dl (ref 3.4–5.0)
Alkaline Phosphatase: 113 U/L (ref 45–117)
Anion Gap: 6 mmol/L (ref 5–15)
BUN: 11 mg/dl (ref 7–25)
CO2: 28 mEq/L (ref 21–32)
Calcium: 9.5 mg/dl (ref 8.5–10.1)
Chloride: 107 mEq/L (ref 98–107)
Creatinine: 1 mg/dl (ref 0.6–1.3)
EGFR IF NonAfrican American: >60
GFR African American: >60
Glucose: 82 mg/dl (ref 74–106)
Potassium: 4.2 mEq/L (ref 3.5–5.1)
Sodium: 141 mEq/L (ref 136–145)
Total Bilirubin: 0.3 mg/dl (ref 0.2–1.0)
Total Protein: 7.2 gm/dl (ref 6.4–8.2)

## 2020-12-17 ENCOUNTER — Encounter

## 2020-12-24 ENCOUNTER — Inpatient Hospital Stay: Payer: BLUE CROSS/BLUE SHIELD | Attending: Physician Assistant | Primary: Physician Assistant

## 2020-12-24 ENCOUNTER — Inpatient Hospital Stay: Admit: 2020-12-24 | Payer: BLUE CROSS/BLUE SHIELD | Attending: Physician Assistant | Primary: Physician Assistant

## 2020-12-24 DIAGNOSIS — R55 Syncope and collapse: Secondary | ICD-10-CM

## 2021-01-09 ENCOUNTER — Encounter

## 2021-01-22 ENCOUNTER — Encounter

## 2021-01-31 ENCOUNTER — Inpatient Hospital Stay: Admit: 2021-01-31 | Payer: BLUE CROSS/BLUE SHIELD | Attending: Neurology | Primary: Physician Assistant

## 2021-01-31 DIAGNOSIS — G40209 Localization-related (focal) (partial) symptomatic epilepsy and epileptic syndromes with complex partial seizures, not intractable, without status epilepticus: Secondary | ICD-10-CM

## 2021-01-31 NOTE — Progress Notes (Signed)
eeg completed.

## 2021-01-31 NOTE — Procedures (Signed)
[place]  Electroencephalogram  NAME:  Vicki Greene, Vicki Greene  DATE: 01/31/2021  EEG#: [id number]  DOB: 1961/03/05  MR#    [id number]  ROOM:  EEG  ACCT#  [id number]  SEX:   F  REFERRING PHYSICIAN: Clementina Mareno R Demont Linford        EEG DESCRIPTION:    The EEG is performed by applying international 10/20 standard electrode placement system and using [employer] 16-channel digital EEG recording machine.     CLINICAL HISTORY:    The EEG is performed on a 60 year old female for evaluation of partial complex seizures.     The tracing begins with the patient awake and the predominant posterior background rhythm consists of bilaterally symmetrical, well-formed, 9 to 10 Hz alpha rhythm.  Occasional eye movements and muscle artifacts are seen.  No EEG changes of sleep are seen.  No consistent focal slowing or abnormal paroxysmal discharges are seen.  No epileptiform activity is seen.     Hyperventilation is not performed.     Photic stimulation produces good bisynchronous driving response.     IMPRESSION:    This is a normal EEG.  No epileptiform activity is seen.      ___________________  Clifton Custard MD   Dictated NF:[id number] R. Posey Pronto, MD  [facility]  D: 01/31/2021 14:51:48  T: 01/31/2021 16:50:11  [id number]

## 2021-02-22 ENCOUNTER — Inpatient Hospital Stay: Admit: 2021-02-22 | Payer: BLUE CROSS/BLUE SHIELD | Attending: Neurology | Primary: Physician Assistant

## 2021-02-22 DIAGNOSIS — R519 Headache, unspecified: Secondary | ICD-10-CM

## 2021-02-22 MED ORDER — GADOBUTROL 1 MMOL/ML (604.72 MG/ML) IV
1 mmol/mL (604.72 mg/mL) | Freq: Once | INTRAVENOUS | Status: AC
Start: 2021-02-22 — End: 2021-02-22
  Administered 2021-02-22: 13:00:00 via INTRAVENOUS

## 2021-02-22 MED FILL — GADAVIST 1 MMOL/ML (604.72 MG/ML) INTRAVENOUS SOLUTION: 1 mmol/mL (604.72 mg/mL) | INTRAVENOUS | Qty: 9

## 2021-03-17 ENCOUNTER — Inpatient Hospital Stay: Payer: BLUE CROSS/BLUE SHIELD | Attending: Cardiovascular Disease | Primary: Physician Assistant

## 2021-06-23 ENCOUNTER — Encounter

## 2021-07-12 ENCOUNTER — Inpatient Hospital Stay: Payer: BLUE CROSS/BLUE SHIELD | Attending: Neurology | Primary: Physician Assistant

## 2021-07-21 ENCOUNTER — Inpatient Hospital Stay: Primary: Physician Assistant

## 2021-08-13 ENCOUNTER — Inpatient Hospital Stay: Admit: 2021-08-13 | Primary: Physician Assistant

## 2021-08-13 ENCOUNTER — Inpatient Hospital Stay: Admit: 2021-08-13 | Payer: BLUE CROSS/BLUE SHIELD | Primary: Physician Assistant

## 2021-08-14 LAB — METABOLIC PANEL, COMPREHENSIVE
ALT (SGPT): 10 U/L (ref 10–49)
AST (SGOT): 14 U/L (ref 0.0–33.9)
Albumin: 3.6 gm/dl (ref 3.4–5.0)
Alk. phosphatase: 116 U/L (ref 46–116)
Anion gap: 6 mmol/L (ref 5–15)
BUN: 14 mg/dl (ref 9–23)
Bilirubin, total: 0.3 mg/dl (ref 0.30–1.20)
CO2: 30 mEq/L (ref 20–31)
Calcium: 9.6 mg/dl (ref 8.7–10.4)
Chloride: 105 mEq/L (ref 98–107)
Creatinine: 0.94 mg/dl (ref 0.55–1.02)
GFR est AA: >60
GFR est non-AA: >60
Glucose: 88 mg/dl (ref 74–106)
Potassium: 3.9 mEq/L (ref 3.5–5.1)
Protein, total: 6.4 gm/dl (ref 5.7–8.2)
Sodium: 141 mEq/L (ref 136–145)

## 2021-08-14 LAB — CK
CK: 137 U/L (ref 34–145)
Total CK: 137 U/L (ref 34–145)

## 2021-08-14 LAB — MAGNESIUM
Magnesium: 1.8 mg/dL (ref 1.6–2.6)
Magnesium: 1.8 mg/dL (ref 1.6–2.6)

## 2021-08-14 LAB — COMPREHENSIVE METABOLIC PANEL
ALT: 10 U/L (ref 10–49)
AST: 14 U/L (ref 0.0–33.9)
Albumin: 3.6 gm/dl (ref 3.4–5.0)
Alkaline Phosphatase: 116 U/L (ref 46–116)
Anion Gap: 6 mmol/L (ref 5–15)
BUN: 14 mg/dl (ref 9–23)
CO2: 30 mEq/L (ref 20–31)
Calcium: 9.6 mg/dl (ref 8.7–10.4)
Chloride: 105 mEq/L (ref 98–107)
Creatinine: 0.94 mg/dl (ref 0.55–1.02)
EGFR IF NonAfrican American: >60
GFR African American: >60
Glucose: 88 mg/dl (ref 74–106)
Potassium: 3.9 mEq/L (ref 3.5–5.1)
Sodium: 141 mEq/L (ref 136–145)
Total Bilirubin: 0.3 mg/dl (ref 0.30–1.20)
Total Protein: 6.4 gm/dl (ref 5.7–8.2)

## 2021-11-17 ENCOUNTER — Inpatient Hospital Stay: Admit: 2021-11-17 | Primary: Physician Assistant

## 2022-06-25 ENCOUNTER — Inpatient Hospital Stay: Admit: 2022-06-25 | Payer: BLUE CROSS/BLUE SHIELD | Primary: Physician Assistant

## 2022-06-25 DIAGNOSIS — I10 Essential (primary) hypertension: Secondary | ICD-10-CM

## 2022-06-26 LAB — COMPREHENSIVE METABOLIC PANEL
ALT: 10 U/L (ref 10–49)
AST: 18 U/L (ref 0.0–33.9)
Albumin: 3.9 gm/dl (ref 3.4–5.0)
Alkaline Phosphatase: 131 U/L — ABNORMAL HIGH (ref 46–116)
Anion Gap: 8 mmol/L (ref 5–15)
BUN: 11 mg/dl (ref 9–23)
CO2: 28 mEq/L (ref 20–31)
Calcium: 9.6 mg/dl (ref 8.7–10.4)
Chloride: 107 mEq/L (ref 98–107)
Creatinine: 1.04 mg/dl — ABNORMAL HIGH (ref 0.55–1.02)
GFR African American: >60
GFR Non-African American: 57
Glucose: 88 mg/dl (ref 74–106)
Potassium: 3.6 mEq/L (ref 3.5–5.1)
Sodium: 142 mEq/L (ref 136–145)
Total Bilirubin: 0.5 mg/dl (ref 0.30–1.20)
Total Protein: 6.5 gm/dl (ref 5.7–8.2)

## 2022-06-26 LAB — URINALYSIS
Bilirubin, Urine: NEGATIVE
Blood, Urine: NEGATIVE
Glucose, Ur: NEGATIVE mg/dl
Ketones, Urine: NEGATIVE mg/dl
Leukocyte Esterase, Urine: NEGATIVE
Nitrite, Urine: NEGATIVE
Protein, Urine: NEGATIVE mg/dl
Specific Gravity, Urine: <=1.005 (ref 1.005–1.030)
Urobilinogen, Urine: 0.2 mg/dl (ref 0.0–1.0)
pH, Urine: 6.5 (ref 5.0–9.0)

## 2022-06-26 LAB — MICROALBUMIN / CREATININE URINE RATIO
Creatinine, Random Urine: 34.4 mg/dl (ref 30.0–125.0)
Microalbumin, Random Urine: <0.3 mg/dl (ref 0.0–29.9)

## 2022-06-26 LAB — HEMOGLOBIN A1C: Hemoglobin A1C: 5.1 % (ref 3.8–5.6)

## 2022-06-27 LAB — CULTURE, URINE: Culture Result: 80000 — AB

## 2023-02-11 NOTE — Progress Notes (Unsigned)
Order received for LDCT Cancer Screening Program due to smoking history:    Attempted to reach patient at 2:18 pm on 02/11/2023 and was unsuccessful at reaching patient, left VM for patient to call our office back at [phone] to go over screening questions and schedule LDCT.     Alexcis Bicking L. Keven Osborn, LPN  [facility] - [facility] Cancer [facility], [facility]  Phone [phone]  Fax [phone]
